# Patient Record
Sex: Female | Born: 1935 | Race: White | Hispanic: No | Smoking: Never smoker
Health system: Southern US, Community
[De-identification: ages and names within clinical notes are randomized; demographics above are authoritative.]

## PROBLEM LIST (undated history)

## (undated) DIAGNOSIS — I4891 Unspecified atrial fibrillation: Secondary | ICD-10-CM

## (undated) DIAGNOSIS — N289 Disorder of kidney and ureter, unspecified: Secondary | ICD-10-CM

## (undated) DIAGNOSIS — I219 Acute myocardial infarction, unspecified: Secondary | ICD-10-CM

## (undated) DIAGNOSIS — E1139 Type 2 diabetes mellitus with other diabetic ophthalmic complication: Secondary | ICD-10-CM

## (undated) DIAGNOSIS — E119 Type 2 diabetes mellitus without complications: Secondary | ICD-10-CM

## (undated) DIAGNOSIS — I10 Essential (primary) hypertension: Secondary | ICD-10-CM

## (undated) DIAGNOSIS — I872 Venous insufficiency (chronic) (peripheral): Secondary | ICD-10-CM

## (undated) DIAGNOSIS — N189 Chronic kidney disease, unspecified: Secondary | ICD-10-CM

## (undated) DIAGNOSIS — F068 Other specified mental disorders due to known physiological condition: Secondary | ICD-10-CM

## (undated) DIAGNOSIS — M810 Age-related osteoporosis without current pathological fracture: Secondary | ICD-10-CM

## (undated) DIAGNOSIS — E785 Hyperlipidemia, unspecified: Secondary | ICD-10-CM

## (undated) DIAGNOSIS — I080 Rheumatic disorders of both mitral and aortic valves: Secondary | ICD-10-CM

## (undated) DIAGNOSIS — K219 Gastro-esophageal reflux disease without esophagitis: Secondary | ICD-10-CM

## (undated) DIAGNOSIS — K279 Peptic ulcer, site unspecified, unspecified as acute or chronic, without hemorrhage or perforation: Secondary | ICD-10-CM

## (undated) HISTORY — DX: Other specified mental disorders due to known physiological condition: F06.8

## (undated) HISTORY — DX: Rheumatic disorders of both mitral and aortic valves: I08.0

## (undated) HISTORY — DX: Type 2 diabetes mellitus without complications: E11.9

## (undated) HISTORY — DX: Gastro-esophageal reflux disease without esophagitis: K21.9

## (undated) HISTORY — DX: Acute myocardial infarction, unspecified: I21.9

## (undated) HISTORY — DX: Peptic ulcer, site unspecified, unspecified as acute or chronic, without hemorrhage or perforation: K27.9

## (undated) HISTORY — DX: Essential (primary) hypertension: I10

## (undated) HISTORY — DX: Age-related osteoporosis without current pathological fracture: M81.0

## (undated) HISTORY — DX: Chronic kidney disease, unspecified: N18.9

## (undated) HISTORY — DX: Disorder of kidney and ureter, unspecified: N28.9

## (undated) HISTORY — DX: Type 2 diabetes mellitus with other diabetic ophthalmic complication: E11.39

## (undated) HISTORY — DX: Venous insufficiency (chronic) (peripheral): I87.2

## (undated) HISTORY — DX: Hyperlipidemia, unspecified: E78.5

## (undated) HISTORY — DX: Unspecified atrial fibrillation: I48.91

---

## 1999-03-11 ENCOUNTER — Other Ambulatory Visit: Admission: RE | Admit: 1999-03-11 | Discharge: 1999-03-11 | Payer: Self-pay | Admitting: Obstetrics and Gynecology

## 1999-08-15 ENCOUNTER — Encounter (INDEPENDENT_AMBULATORY_CARE_PROVIDER_SITE_OTHER): Payer: Self-pay | Admitting: Specialist

## 1999-08-15 ENCOUNTER — Other Ambulatory Visit: Admission: RE | Admit: 1999-08-15 | Discharge: 1999-08-15 | Payer: Self-pay | Admitting: Gastroenterology

## 1999-09-03 ENCOUNTER — Encounter: Payer: Self-pay | Admitting: Neurology

## 1999-09-03 ENCOUNTER — Encounter: Admission: RE | Admit: 1999-09-03 | Discharge: 1999-09-03 | Payer: Self-pay | Admitting: Neurology

## 2002-11-07 ENCOUNTER — Other Ambulatory Visit: Admission: RE | Admit: 2002-11-07 | Discharge: 2002-11-07 | Payer: Self-pay | Admitting: Obstetrics and Gynecology

## 2004-02-02 ENCOUNTER — Emergency Department (HOSPITAL_COMMUNITY): Admission: EM | Admit: 2004-02-02 | Discharge: 2004-02-02 | Payer: Self-pay | Admitting: Emergency Medicine

## 2004-02-27 ENCOUNTER — Other Ambulatory Visit: Admission: RE | Admit: 2004-02-27 | Discharge: 2004-02-27 | Payer: Self-pay | Admitting: Obstetrics and Gynecology

## 2004-05-08 ENCOUNTER — Ambulatory Visit: Payer: Self-pay | Admitting: Internal Medicine

## 2004-08-14 ENCOUNTER — Ambulatory Visit: Payer: Self-pay | Admitting: Internal Medicine

## 2004-08-26 ENCOUNTER — Ambulatory Visit: Payer: Self-pay

## 2004-11-12 ENCOUNTER — Ambulatory Visit: Payer: Self-pay | Admitting: Internal Medicine

## 2004-12-25 ENCOUNTER — Emergency Department (HOSPITAL_COMMUNITY): Admission: EM | Admit: 2004-12-25 | Discharge: 2004-12-25 | Payer: Self-pay | Admitting: *Deleted

## 2005-02-11 ENCOUNTER — Ambulatory Visit: Payer: Self-pay | Admitting: Internal Medicine

## 2005-03-19 ENCOUNTER — Ambulatory Visit: Payer: Self-pay | Admitting: Internal Medicine

## 2005-05-07 ENCOUNTER — Ambulatory Visit: Payer: Self-pay | Admitting: Internal Medicine

## 2005-07-20 ENCOUNTER — Ambulatory Visit: Payer: Self-pay | Admitting: Internal Medicine

## 2005-08-27 ENCOUNTER — Ambulatory Visit: Payer: Self-pay | Admitting: Internal Medicine

## 2005-09-04 ENCOUNTER — Ambulatory Visit: Payer: Self-pay | Admitting: Internal Medicine

## 2005-10-12 ENCOUNTER — Ambulatory Visit: Payer: Self-pay | Admitting: Internal Medicine

## 2005-12-08 ENCOUNTER — Ambulatory Visit: Payer: Self-pay | Admitting: Internal Medicine

## 2006-01-20 ENCOUNTER — Ambulatory Visit: Payer: Self-pay | Admitting: Internal Medicine

## 2006-05-05 ENCOUNTER — Inpatient Hospital Stay (HOSPITAL_COMMUNITY): Admission: EM | Admit: 2006-05-05 | Discharge: 2006-05-09 | Payer: Self-pay | Admitting: Emergency Medicine

## 2006-05-05 ENCOUNTER — Ambulatory Visit: Payer: Self-pay | Admitting: Internal Medicine

## 2006-05-05 ENCOUNTER — Ambulatory Visit: Payer: Self-pay | Admitting: Cardiology

## 2006-05-05 LAB — CONVERTED CEMR LAB
ALT: 16 units/L (ref 0–40)
AST: 30 units/L (ref 0–37)
BUN: 22 mg/dL (ref 6–23)
Cholesterol: 126 mg/dL (ref 0–200)
Creatinine, Ser: 1.3 mg/dL — ABNORMAL HIGH (ref 0.4–1.2)
Glucose, Bld: 128 mg/dL — ABNORMAL HIGH (ref 70–99)
Sodium: 142 meq/L (ref 135–145)

## 2006-05-06 ENCOUNTER — Encounter: Payer: Self-pay | Admitting: Cardiology

## 2006-05-12 ENCOUNTER — Ambulatory Visit: Payer: Self-pay | Admitting: Cardiology

## 2006-05-19 ENCOUNTER — Ambulatory Visit: Payer: Self-pay | Admitting: Internal Medicine

## 2006-05-27 ENCOUNTER — Ambulatory Visit: Payer: Self-pay | Admitting: Cardiology

## 2006-06-28 ENCOUNTER — Ambulatory Visit: Payer: Self-pay | Admitting: Cardiology

## 2006-06-28 ENCOUNTER — Ambulatory Visit: Payer: Self-pay | Admitting: Internal Medicine

## 2006-08-10 ENCOUNTER — Ambulatory Visit: Payer: Self-pay | Admitting: Internal Medicine

## 2006-08-10 LAB — CONVERTED CEMR LAB
ALT: 13 units/L (ref 0–40)
Bilirubin, Direct: 0.2 mg/dL (ref 0.0–0.3)
CO2: 31 meq/L (ref 19–32)
Calcium: 9.1 mg/dL (ref 8.4–10.5)
GFR calc Af Amer: 60 mL/min
GFR calc non Af Amer: 50 mL/min
Glucose, Bld: 220 mg/dL — ABNORMAL HIGH (ref 70–99)
Hgb A1c MFr Bld: 8.4 % — ABNORMAL HIGH (ref 4.6–6.0)
Sodium: 141 meq/L (ref 135–145)
Total Protein: 6.2 g/dL (ref 6.0–8.3)

## 2006-09-09 ENCOUNTER — Ambulatory Visit: Payer: Self-pay | Admitting: Cardiology

## 2006-09-20 ENCOUNTER — Ambulatory Visit: Payer: Self-pay | Admitting: Internal Medicine

## 2006-11-02 ENCOUNTER — Ambulatory Visit: Payer: Self-pay | Admitting: Internal Medicine

## 2006-11-02 LAB — CONVERTED CEMR LAB
ALT: 18 units/L (ref 0–35)
AST: 22 units/L (ref 0–37)
Albumin: 3.3 g/dL — ABNORMAL LOW (ref 3.5–5.2)
Basophils Absolute: 0.1 10*3/uL (ref 0.0–0.1)
Calcium: 9 mg/dL (ref 8.4–10.5)
Chloride: 106 meq/L (ref 96–112)
Creatinine, Ser: 0.9 mg/dL (ref 0.4–1.2)
Eosinophils Absolute: 0.3 10*3/uL (ref 0.0–0.6)
Eosinophils Relative: 4.9 % (ref 0.0–5.0)
GFR calc non Af Amer: 63 mL/min
HCT: 36.9 % (ref 36.0–46.0)
MCHC: 34.2 g/dL (ref 30.0–36.0)
MCV: 87.3 fL (ref 78.0–100.0)
Platelets: 222 10*3/uL (ref 150–400)
RBC: 4.23 M/uL (ref 3.87–5.11)
RDW: 12.3 % (ref 11.5–14.6)
Total Bilirubin: 0.8 mg/dL (ref 0.3–1.2)
WBC: 5.8 10*3/uL (ref 4.5–10.5)

## 2006-11-09 ENCOUNTER — Ambulatory Visit: Payer: Self-pay | Admitting: Internal Medicine

## 2006-12-27 ENCOUNTER — Encounter: Payer: Self-pay | Admitting: Internal Medicine

## 2006-12-27 DIAGNOSIS — Z9089 Acquired absence of other organs: Secondary | ICD-10-CM | POA: Insufficient documentation

## 2006-12-27 DIAGNOSIS — I219 Acute myocardial infarction, unspecified: Secondary | ICD-10-CM | POA: Insufficient documentation

## 2006-12-27 DIAGNOSIS — K279 Peptic ulcer, site unspecified, unspecified as acute or chronic, without hemorrhage or perforation: Secondary | ICD-10-CM | POA: Insufficient documentation

## 2006-12-27 DIAGNOSIS — E1139 Type 2 diabetes mellitus with other diabetic ophthalmic complication: Secondary | ICD-10-CM | POA: Insufficient documentation

## 2006-12-27 DIAGNOSIS — E785 Hyperlipidemia, unspecified: Secondary | ICD-10-CM

## 2006-12-27 DIAGNOSIS — M810 Age-related osteoporosis without current pathological fracture: Secondary | ICD-10-CM | POA: Insufficient documentation

## 2006-12-27 DIAGNOSIS — K219 Gastro-esophageal reflux disease without esophagitis: Secondary | ICD-10-CM | POA: Insufficient documentation

## 2006-12-27 DIAGNOSIS — I080 Rheumatic disorders of both mitral and aortic valves: Secondary | ICD-10-CM

## 2006-12-27 DIAGNOSIS — N189 Chronic kidney disease, unspecified: Secondary | ICD-10-CM

## 2006-12-27 DIAGNOSIS — E119 Type 2 diabetes mellitus without complications: Secondary | ICD-10-CM

## 2006-12-27 DIAGNOSIS — F068 Other specified mental disorders due to known physiological condition: Secondary | ICD-10-CM

## 2006-12-27 DIAGNOSIS — I4891 Unspecified atrial fibrillation: Secondary | ICD-10-CM | POA: Insufficient documentation

## 2007-02-10 ENCOUNTER — Ambulatory Visit: Payer: Self-pay | Admitting: Internal Medicine

## 2007-02-10 LAB — CONVERTED CEMR LAB
CO2: 28 meq/L (ref 19–32)
Calcium: 9 mg/dL (ref 8.4–10.5)
Chloride: 103 meq/L (ref 96–112)
Creatinine, Ser: 0.8 mg/dL (ref 0.4–1.2)
Glucose, Bld: 133 mg/dL — ABNORMAL HIGH (ref 70–99)

## 2007-02-17 ENCOUNTER — Ambulatory Visit: Payer: Self-pay | Admitting: Internal Medicine

## 2007-02-17 ENCOUNTER — Encounter: Payer: Self-pay | Admitting: Internal Medicine

## 2007-03-18 ENCOUNTER — Telehealth: Payer: Self-pay | Admitting: Internal Medicine

## 2007-03-18 ENCOUNTER — Encounter: Payer: Self-pay | Admitting: Internal Medicine

## 2007-05-13 ENCOUNTER — Ambulatory Visit: Payer: Self-pay | Admitting: Internal Medicine

## 2007-05-13 DIAGNOSIS — N181 Chronic kidney disease, stage 1: Secondary | ICD-10-CM | POA: Insufficient documentation

## 2007-05-13 LAB — CONVERTED CEMR LAB
ALT: 15 units/L (ref 0–35)
AST: 19 units/L (ref 0–37)
Bilirubin, Direct: 0.1 mg/dL (ref 0.0–0.3)
CO2: 31 meq/L (ref 19–32)
Calcium: 9.4 mg/dL (ref 8.4–10.5)
Chloride: 106 meq/L (ref 96–112)
Creatinine, Ser: 0.9 mg/dL (ref 0.4–1.2)
GFR calc non Af Amer: 63 mL/min
Glucose, Bld: 160 mg/dL — ABNORMAL HIGH (ref 70–99)
Total Bilirubin: 1 mg/dL (ref 0.3–1.2)

## 2007-05-16 ENCOUNTER — Encounter: Payer: Self-pay | Admitting: Internal Medicine

## 2007-05-20 ENCOUNTER — Ambulatory Visit: Payer: Self-pay | Admitting: Internal Medicine

## 2007-05-20 DIAGNOSIS — L299 Pruritus, unspecified: Secondary | ICD-10-CM | POA: Insufficient documentation

## 2007-06-02 ENCOUNTER — Encounter: Payer: Self-pay | Admitting: Internal Medicine

## 2007-07-11 ENCOUNTER — Encounter: Payer: Self-pay | Admitting: Internal Medicine

## 2007-08-24 ENCOUNTER — Encounter: Payer: Self-pay | Admitting: Internal Medicine

## 2007-09-14 ENCOUNTER — Ambulatory Visit: Payer: Self-pay | Admitting: Internal Medicine

## 2007-09-15 ENCOUNTER — Encounter: Payer: Self-pay | Admitting: Internal Medicine

## 2007-09-16 LAB — CONVERTED CEMR LAB
ALT: 16 units/L (ref 0–35)
Bilirubin, Direct: 0.1 mg/dL (ref 0.0–0.3)
CO2: 29 meq/L (ref 19–32)
Calcium: 9.3 mg/dL (ref 8.4–10.5)
GFR calc Af Amer: 67 mL/min
HDL: 41.4 mg/dL (ref >39.0)
Sodium: 140 meq/L (ref 135–145)
TSH: 3.26 microintl units/mL (ref 0.35–5.50)
Total Bilirubin: 0.8 mg/dL (ref 0.3–1.2)
Total CHOL/HDL Ratio: 4.1

## 2007-09-19 ENCOUNTER — Ambulatory Visit: Payer: Self-pay | Admitting: Internal Medicine

## 2007-09-19 DIAGNOSIS — H01009 Unspecified blepharitis unspecified eye, unspecified eyelid: Secondary | ICD-10-CM | POA: Insufficient documentation

## 2007-09-19 DIAGNOSIS — I10 Essential (primary) hypertension: Secondary | ICD-10-CM | POA: Insufficient documentation

## 2007-10-08 ENCOUNTER — Emergency Department (HOSPITAL_COMMUNITY): Admission: EM | Admit: 2007-10-08 | Discharge: 2007-10-08 | Payer: Self-pay | Admitting: Emergency Medicine

## 2007-10-08 ENCOUNTER — Telehealth: Payer: Self-pay | Admitting: Internal Medicine

## 2007-10-10 ENCOUNTER — Encounter: Payer: Self-pay | Admitting: Internal Medicine

## 2007-12-07 ENCOUNTER — Telehealth: Payer: Self-pay | Admitting: Internal Medicine

## 2007-12-12 ENCOUNTER — Encounter: Payer: Self-pay | Admitting: Internal Medicine

## 2007-12-16 ENCOUNTER — Ambulatory Visit: Payer: Self-pay | Admitting: Internal Medicine

## 2007-12-16 LAB — CONVERTED CEMR LAB
ALT: 16 units/L (ref 0–35)
AST: 23 units/L (ref 0–37)
Alkaline Phosphatase: 106 units/L (ref 39–117)
Bilirubin, Direct: 0.2 mg/dL (ref 0.0–0.3)
CO2: 31 meq/L (ref 19–32)
Chloride: 106 meq/L (ref 96–112)
Sodium: 144 meq/L (ref 135–145)
Total Bilirubin: 0.9 mg/dL (ref 0.3–1.2)

## 2007-12-19 ENCOUNTER — Telehealth: Payer: Self-pay | Admitting: Internal Medicine

## 2007-12-20 ENCOUNTER — Telehealth: Payer: Self-pay | Admitting: Internal Medicine

## 2008-01-04 ENCOUNTER — Ambulatory Visit: Payer: Self-pay | Admitting: Internal Medicine

## 2008-01-04 DIAGNOSIS — K149 Disease of tongue, unspecified: Secondary | ICD-10-CM | POA: Insufficient documentation

## 2008-01-13 ENCOUNTER — Encounter: Payer: Self-pay | Admitting: Internal Medicine

## 2008-01-20 ENCOUNTER — Telehealth: Payer: Self-pay | Admitting: Internal Medicine

## 2008-01-23 ENCOUNTER — Encounter: Payer: Self-pay | Admitting: Internal Medicine

## 2008-02-02 ENCOUNTER — Encounter: Payer: Self-pay | Admitting: Internal Medicine

## 2008-02-06 ENCOUNTER — Encounter: Payer: Self-pay | Admitting: Internal Medicine

## 2008-02-18 ENCOUNTER — Encounter: Payer: Self-pay | Admitting: Internal Medicine

## 2008-03-27 ENCOUNTER — Encounter: Payer: Self-pay | Admitting: Internal Medicine

## 2008-04-20 DIAGNOSIS — I872 Venous insufficiency (chronic) (peripheral): Secondary | ICD-10-CM

## 2008-04-20 HISTORY — DX: Venous insufficiency (chronic) (peripheral): I87.2

## 2008-04-26 ENCOUNTER — Ambulatory Visit: Payer: Self-pay | Admitting: Internal Medicine

## 2008-04-27 LAB — CONVERTED CEMR LAB
CO2: 29 meq/L (ref 19–32)
Calcium: 9.4 mg/dL (ref 8.4–10.5)
Creatinine, Ser: 0.8 mg/dL (ref 0.4–1.2)
Glucose, Bld: 193 mg/dL — ABNORMAL HIGH (ref 70–99)

## 2008-05-04 ENCOUNTER — Telehealth: Payer: Self-pay | Admitting: Internal Medicine

## 2008-05-04 ENCOUNTER — Ambulatory Visit: Payer: Self-pay | Admitting: Internal Medicine

## 2008-05-04 DIAGNOSIS — N259 Disorder resulting from impaired renal tubular function, unspecified: Secondary | ICD-10-CM | POA: Insufficient documentation

## 2008-06-14 ENCOUNTER — Encounter: Payer: Self-pay | Admitting: Internal Medicine

## 2008-06-15 ENCOUNTER — Telehealth: Payer: Self-pay | Admitting: Internal Medicine

## 2008-06-29 ENCOUNTER — Encounter: Payer: Self-pay | Admitting: Internal Medicine

## 2008-07-02 ENCOUNTER — Telehealth: Payer: Self-pay | Admitting: Internal Medicine

## 2008-07-18 ENCOUNTER — Ambulatory Visit: Payer: Self-pay | Admitting: Internal Medicine

## 2008-07-18 DIAGNOSIS — R609 Edema, unspecified: Secondary | ICD-10-CM

## 2008-07-19 ENCOUNTER — Ambulatory Visit: Payer: Self-pay

## 2008-07-22 LAB — CONVERTED CEMR LAB
ALT: 21 units/L (ref 0–35)
AST: 22 units/L (ref 0–37)
BUN: 17 mg/dL (ref 6–23)
Bilirubin, Direct: 0 mg/dL (ref 0.0–0.3)
CO2: 32 meq/L (ref 19–32)
GFR calc non Af Amer: 55.42 mL/min (ref >60)
Glucose, Bld: 294 mg/dL — ABNORMAL HIGH (ref 70–99)
Nitrite: NEGATIVE
Potassium: 4.1 meq/L (ref 3.5–5.1)
Specific Gravity, Urine: 1.015 (ref 1.000–1.030)
Total Bilirubin: 0.7 mg/dL (ref 0.3–1.2)
Total Protein, Urine: NEGATIVE mg/dL
Urine Glucose: NEGATIVE mg/dL
Urobilinogen, UA: 0.2 (ref 0.0–1.0)

## 2008-08-01 ENCOUNTER — Encounter: Payer: Self-pay | Admitting: Internal Medicine

## 2008-08-13 ENCOUNTER — Telehealth: Payer: Self-pay | Admitting: Internal Medicine

## 2008-08-27 ENCOUNTER — Encounter: Payer: Self-pay | Admitting: Internal Medicine

## 2008-08-28 ENCOUNTER — Telehealth: Payer: Self-pay | Admitting: Internal Medicine

## 2008-08-28 ENCOUNTER — Encounter: Payer: Self-pay | Admitting: Internal Medicine

## 2008-08-29 ENCOUNTER — Ambulatory Visit: Payer: Self-pay | Admitting: Internal Medicine

## 2008-08-29 LAB — CONVERTED CEMR LAB
BUN: 19 mg/dL (ref 6–23)
CO2: 30 meq/L (ref 19–32)
Chloride: 103 meq/L (ref 96–112)
Creatinine, Ser: 1 mg/dL (ref 0.4–1.2)
Glucose, Bld: 263 mg/dL — ABNORMAL HIGH (ref 70–99)
Hgb A1c MFr Bld: 10.1 % — ABNORMAL HIGH (ref 4.6–6.5)

## 2008-09-05 ENCOUNTER — Ambulatory Visit: Payer: Self-pay | Admitting: Internal Medicine

## 2008-09-26 ENCOUNTER — Encounter: Payer: Self-pay | Admitting: Internal Medicine

## 2008-10-02 ENCOUNTER — Encounter: Payer: Self-pay | Admitting: Internal Medicine

## 2008-10-03 ENCOUNTER — Telehealth: Payer: Self-pay | Admitting: Internal Medicine

## 2008-10-04 ENCOUNTER — Ambulatory Visit: Payer: Self-pay | Admitting: Internal Medicine

## 2008-10-09 ENCOUNTER — Encounter: Payer: Self-pay | Admitting: Internal Medicine

## 2008-10-18 ENCOUNTER — Encounter: Payer: Self-pay | Admitting: Internal Medicine

## 2008-10-18 ENCOUNTER — Telehealth: Payer: Self-pay | Admitting: Internal Medicine

## 2008-10-24 ENCOUNTER — Telehealth: Payer: Self-pay | Admitting: Internal Medicine

## 2008-10-25 ENCOUNTER — Ambulatory Visit: Payer: Self-pay | Admitting: Internal Medicine

## 2008-10-26 ENCOUNTER — Ambulatory Visit: Payer: Self-pay | Admitting: Internal Medicine

## 2008-10-31 ENCOUNTER — Telehealth: Payer: Self-pay | Admitting: Internal Medicine

## 2008-11-01 ENCOUNTER — Telehealth: Payer: Self-pay | Admitting: Internal Medicine

## 2008-11-01 ENCOUNTER — Ambulatory Visit: Payer: Self-pay | Admitting: Internal Medicine

## 2008-11-01 DIAGNOSIS — R748 Abnormal levels of other serum enzymes: Secondary | ICD-10-CM | POA: Insufficient documentation

## 2008-11-01 DIAGNOSIS — R21 Rash and other nonspecific skin eruption: Secondary | ICD-10-CM | POA: Insufficient documentation

## 2008-11-01 LAB — CONVERTED CEMR LAB
BUN: 17 mg/dL (ref 6–23)
Bilirubin Urine: NEGATIVE
Bilirubin, Direct: 0.1 mg/dL (ref 0.0–0.3)
CO2: 34 meq/L — ABNORMAL HIGH (ref 19–32)
Chloride: 97 meq/L (ref 96–112)
Creatinine, Ser: 1 mg/dL (ref 0.4–1.2)
Eosinophils Absolute: 0.3 10*3/uL (ref 0.0–0.7)
Glucose, Bld: 295 mg/dL — ABNORMAL HIGH (ref 70–99)
HCT: 36.8 % (ref 36.0–46.0)
Hgb A1c MFr Bld: 10.9 % — ABNORMAL HIGH (ref 4.6–6.5)
Ketones, ur: NEGATIVE mg/dL
Lymphs Abs: 1.5 10*3/uL (ref 0.7–4.0)
MCHC: 34.8 g/dL (ref 30.0–36.0)
MCV: 89.9 fL (ref 78.0–100.0)
Monocytes Absolute: 0.7 10*3/uL (ref 0.1–1.0)
Neutrophils Relative %: 60.9 % (ref 43.0–77.0)
Nitrite: NEGATIVE
Platelets: 214 10*3/uL (ref 150.0–400.0)
Potassium: 4 meq/L (ref 3.5–5.1)
RDW: 12.6 % (ref 11.5–14.6)
Sed Rate: 25 mm/hr — ABNORMAL HIGH (ref 0–22)
Specific Gravity, Urine: 1.025 (ref 1.000–1.030)
TSH: 3.72 microintl units/mL (ref 0.35–5.50)
Total Bilirubin: 0.6 mg/dL (ref 0.3–1.2)
Urobilinogen, UA: 0.2 (ref 0.0–1.0)
pH: 5 (ref 5.0–8.0)

## 2008-11-02 ENCOUNTER — Encounter: Payer: Self-pay | Admitting: Internal Medicine

## 2008-11-16 ENCOUNTER — Telehealth: Payer: Self-pay | Admitting: Internal Medicine

## 2008-11-19 ENCOUNTER — Ambulatory Visit: Payer: Self-pay | Admitting: Internal Medicine

## 2008-11-19 ENCOUNTER — Encounter: Payer: Self-pay | Admitting: Internal Medicine

## 2008-11-20 ENCOUNTER — Encounter: Payer: Self-pay | Admitting: Internal Medicine

## 2008-11-21 ENCOUNTER — Encounter: Payer: Self-pay | Admitting: Internal Medicine

## 2008-11-26 ENCOUNTER — Telehealth: Payer: Self-pay | Admitting: Internal Medicine

## 2008-12-11 ENCOUNTER — Encounter: Payer: Self-pay | Admitting: Internal Medicine

## 2008-12-20 ENCOUNTER — Telehealth: Payer: Self-pay | Admitting: Internal Medicine

## 2008-12-26 ENCOUNTER — Ambulatory Visit: Payer: Self-pay | Admitting: Internal Medicine

## 2008-12-27 LAB — CONVERTED CEMR LAB
ALT: 21 units/L (ref 0–35)
BUN: 18 mg/dL (ref 6–23)
Bilirubin, Direct: 0.1 mg/dL (ref 0.0–0.3)
CO2: 31 meq/L (ref 19–32)
Calcium: 9.1 mg/dL (ref 8.4–10.5)
Chloride: 104 meq/L (ref 96–112)
Creatinine, Ser: 0.9 mg/dL (ref 0.4–1.2)
Hgb A1c MFr Bld: 9.5 % — ABNORMAL HIGH (ref 4.6–6.5)
Total Protein: 6.2 g/dL (ref 6.0–8.3)

## 2008-12-31 ENCOUNTER — Ambulatory Visit: Payer: Self-pay | Admitting: Internal Medicine

## 2009-01-07 ENCOUNTER — Encounter: Payer: Self-pay | Admitting: Internal Medicine

## 2009-01-24 ENCOUNTER — Encounter: Payer: Self-pay | Admitting: Internal Medicine

## 2009-03-13 ENCOUNTER — Telehealth: Payer: Self-pay | Admitting: Internal Medicine

## 2009-03-28 ENCOUNTER — Ambulatory Visit: Payer: Self-pay | Admitting: Internal Medicine

## 2009-03-30 LAB — CONVERTED CEMR LAB
AST: 24 units/L (ref 0–37)
Albumin: 3.5 g/dL (ref 3.5–5.2)
Alkaline Phosphatase: 132 units/L — ABNORMAL HIGH (ref 39–117)
Bilirubin, Direct: 0.1 mg/dL (ref 0.0–0.3)
Calcium: 9.2 mg/dL (ref 8.4–10.5)
GFR calc non Af Amer: 55.33 mL/min (ref >60)
Potassium: 3.6 meq/L (ref 3.5–5.1)
Sodium: 145 meq/L (ref 135–145)
Total Bilirubin: 0.7 mg/dL (ref 0.3–1.2)

## 2009-04-18 ENCOUNTER — Telehealth: Payer: Self-pay | Admitting: Internal Medicine

## 2009-04-18 ENCOUNTER — Ambulatory Visit: Payer: Self-pay | Admitting: Internal Medicine

## 2009-04-18 DIAGNOSIS — J209 Acute bronchitis, unspecified: Secondary | ICD-10-CM | POA: Insufficient documentation

## 2009-04-24 ENCOUNTER — Encounter: Payer: Self-pay | Admitting: Internal Medicine

## 2009-04-29 ENCOUNTER — Encounter: Payer: Self-pay | Admitting: Internal Medicine

## 2009-05-13 ENCOUNTER — Telehealth: Payer: Self-pay | Admitting: Internal Medicine

## 2009-05-21 ENCOUNTER — Encounter: Payer: Self-pay | Admitting: Internal Medicine

## 2009-05-29 ENCOUNTER — Encounter: Payer: Self-pay | Admitting: Internal Medicine

## 2009-06-03 ENCOUNTER — Ambulatory Visit: Payer: Self-pay | Admitting: Internal Medicine

## 2009-06-05 ENCOUNTER — Encounter: Payer: Self-pay | Admitting: Internal Medicine

## 2009-06-17 ENCOUNTER — Telehealth: Payer: Self-pay | Admitting: Internal Medicine

## 2009-06-18 ENCOUNTER — Encounter: Payer: Self-pay | Admitting: Internal Medicine

## 2009-06-19 ENCOUNTER — Telehealth: Payer: Self-pay | Admitting: Internal Medicine

## 2009-06-20 ENCOUNTER — Ambulatory Visit: Payer: Self-pay | Admitting: Internal Medicine

## 2009-06-25 ENCOUNTER — Encounter: Payer: Self-pay | Admitting: Internal Medicine

## 2009-07-10 ENCOUNTER — Ambulatory Visit: Payer: Self-pay | Admitting: Internal Medicine

## 2009-07-10 LAB — CONVERTED CEMR LAB
BUN: 15 mg/dL (ref 6–23)
Bilirubin, Direct: 0.1 mg/dL (ref 0.0–0.3)
CO2: 34 meq/L — ABNORMAL HIGH (ref 19–32)
Chloride: 102 meq/L (ref 96–112)
Cholesterol: 140 mg/dL (ref 0–200)
Creatinine, Ser: 0.9 mg/dL (ref 0.4–1.2)
Glucose, Bld: 93 mg/dL (ref 70–99)
LDL Cholesterol: 69 mg/dL (ref 0–99)
Potassium: 3.9 meq/L (ref 3.5–5.1)
Total CHOL/HDL Ratio: 3
Total Protein: 6.8 g/dL (ref 6.0–8.3)
VLDL: 25.4 mg/dL (ref 0.0–40.0)

## 2009-07-15 ENCOUNTER — Encounter: Payer: Self-pay | Admitting: Internal Medicine

## 2009-07-17 ENCOUNTER — Ambulatory Visit: Payer: Self-pay | Admitting: Internal Medicine

## 2009-07-19 ENCOUNTER — Telehealth: Payer: Self-pay | Admitting: Internal Medicine

## 2009-07-19 ENCOUNTER — Ambulatory Visit: Payer: Self-pay | Admitting: Internal Medicine

## 2009-07-19 DIAGNOSIS — R10819 Abdominal tenderness, unspecified site: Secondary | ICD-10-CM

## 2009-07-21 ENCOUNTER — Encounter: Payer: Self-pay | Admitting: Internal Medicine

## 2009-07-24 ENCOUNTER — Ambulatory Visit: Payer: Self-pay | Admitting: Internal Medicine

## 2009-07-24 ENCOUNTER — Telehealth: Payer: Self-pay | Admitting: Internal Medicine

## 2009-07-30 ENCOUNTER — Telehealth: Payer: Self-pay | Admitting: Internal Medicine

## 2009-08-08 ENCOUNTER — Telehealth: Payer: Self-pay | Admitting: Internal Medicine

## 2009-08-13 ENCOUNTER — Encounter: Payer: Self-pay | Admitting: Internal Medicine

## 2009-08-16 ENCOUNTER — Telehealth: Payer: Self-pay | Admitting: Internal Medicine

## 2009-09-17 ENCOUNTER — Encounter: Payer: Self-pay | Admitting: Internal Medicine

## 2009-09-20 ENCOUNTER — Encounter: Payer: Self-pay | Admitting: Internal Medicine

## 2009-09-25 ENCOUNTER — Telehealth: Payer: Self-pay | Admitting: Internal Medicine

## 2009-09-25 ENCOUNTER — Encounter: Payer: Self-pay | Admitting: Internal Medicine

## 2009-09-26 ENCOUNTER — Encounter: Payer: Self-pay | Admitting: Internal Medicine

## 2009-11-07 ENCOUNTER — Ambulatory Visit: Payer: Self-pay | Admitting: Internal Medicine

## 2009-11-12 LAB — CONVERTED CEMR LAB
Chloride: 110 meq/L (ref 96–112)
GFR calc non Af Amer: 70.47 mL/min (ref >60)
Glucose, Bld: 68 mg/dL — ABNORMAL LOW (ref 70–99)
Potassium: 4.1 meq/L (ref 3.5–5.1)
Sodium: 146 meq/L — ABNORMAL HIGH (ref 135–145)
Vitamin B-12: 193 pg/mL — ABNORMAL LOW (ref 211–911)

## 2009-11-14 ENCOUNTER — Ambulatory Visit: Payer: Self-pay | Admitting: Internal Medicine

## 2009-11-14 DIAGNOSIS — E162 Hypoglycemia, unspecified: Secondary | ICD-10-CM

## 2009-11-14 DIAGNOSIS — E538 Deficiency of other specified B group vitamins: Secondary | ICD-10-CM | POA: Insufficient documentation

## 2009-11-15 ENCOUNTER — Encounter: Payer: Self-pay | Admitting: Internal Medicine

## 2009-11-20 ENCOUNTER — Encounter: Payer: Self-pay | Admitting: Internal Medicine

## 2009-11-22 ENCOUNTER — Ambulatory Visit: Payer: Self-pay | Admitting: Internal Medicine

## 2009-11-25 ENCOUNTER — Encounter: Payer: Self-pay | Admitting: Internal Medicine

## 2009-11-26 ENCOUNTER — Encounter: Payer: Self-pay | Admitting: Internal Medicine

## 2009-11-29 ENCOUNTER — Encounter: Payer: Self-pay | Admitting: Internal Medicine

## 2009-12-04 ENCOUNTER — Telehealth: Payer: Self-pay | Admitting: Internal Medicine

## 2009-12-04 ENCOUNTER — Encounter: Payer: Self-pay | Admitting: Internal Medicine

## 2009-12-05 ENCOUNTER — Encounter: Payer: Self-pay | Admitting: Internal Medicine

## 2009-12-17 ENCOUNTER — Telehealth: Payer: Self-pay | Admitting: Internal Medicine

## 2009-12-20 ENCOUNTER — Encounter: Payer: Self-pay | Admitting: Internal Medicine

## 2009-12-26 ENCOUNTER — Encounter: Payer: Self-pay | Admitting: Internal Medicine

## 2010-01-15 ENCOUNTER — Ambulatory Visit: Payer: Self-pay | Admitting: Internal Medicine

## 2010-02-07 ENCOUNTER — Ambulatory Visit: Payer: Self-pay | Admitting: Internal Medicine

## 2010-02-08 LAB — CONVERTED CEMR LAB
Basophils Relative: 0.8 % (ref 0.0–3.0)
Chloride: 106 meq/L (ref 96–112)
Eosinophils Relative: 3.2 % (ref 0.0–5.0)
GFR calc non Af Amer: 67.53 mL/min (ref >60)
Glucose, Bld: 76 mg/dL (ref 70–99)
Hgb A1c MFr Bld: 7.8 % — ABNORMAL HIGH (ref 4.6–6.5)
Lymphocytes Relative: 20.1 % (ref 12.0–46.0)
Neutrophils Relative %: 67.5 % (ref 43.0–77.0)
Potassium: 4.3 meq/L (ref 3.5–5.1)
RBC: 4.56 M/uL (ref 3.87–5.11)
Sodium: 144 meq/L (ref 135–145)
WBC: 7.8 10*3/uL (ref 4.5–10.5)

## 2010-02-12 ENCOUNTER — Encounter: Payer: Self-pay | Admitting: Internal Medicine

## 2010-02-14 ENCOUNTER — Ambulatory Visit: Payer: Self-pay | Admitting: Internal Medicine

## 2010-02-20 ENCOUNTER — Encounter: Payer: Self-pay | Admitting: Internal Medicine

## 2010-04-24 ENCOUNTER — Encounter: Payer: Self-pay | Admitting: Internal Medicine

## 2010-05-01 ENCOUNTER — Telehealth: Payer: Self-pay | Admitting: Internal Medicine

## 2010-05-01 ENCOUNTER — Encounter: Payer: Self-pay | Admitting: Internal Medicine

## 2010-05-08 ENCOUNTER — Encounter: Payer: Self-pay | Admitting: Internal Medicine

## 2010-05-19 ENCOUNTER — Encounter: Payer: Self-pay | Admitting: Internal Medicine

## 2010-05-20 NOTE — Miscellaneous (Signed)
Summary: Plan/[facility]  Plan/[facility]   Imported By: Lester Byron 06/08/2009 10:52:04  _____________________________________________________________________  External Attachment:    Type:   Image     Comment:   External Document

## 2010-05-20 NOTE — Miscellaneous (Signed)
Summary: Rx quarterly/[facility]  Rx quarterly/[facility]   Imported By: Lester [place] 02/17/2010 09:10:19  _____________________________________________________________________  External Attachment:    Type:   Image     Comment:   External Document

## 2010-05-20 NOTE — Letter (Signed)
Summary: CMN for Diabetic Supplies/[facility]  CMN for Diabetic Supplies/[facility]   Imported By: Sherian Rein 06/28/2009 14:59:11  _____________________________________________________________________  External Attachment:    Type:   Image     Comment:   External Document

## 2010-05-20 NOTE — Miscellaneous (Signed)
Summary: Plan of Care / [facility]  Plan of Care / [facility]   Imported By: Lennie Odor 01/17/2010 14:34:02  _____________________________________________________________________  External Attachment:    Type:   Image     Comment:   External Document

## 2010-05-20 NOTE — Progress Notes (Signed)
Summary: FLU VACCINE  Phone Note From Other Clinic   Summary of Call: Pt recieved flu vaccine from [facility], EMR UPDATED Initial call taken by: Lamar Sprinkles, CMA,  May 13, 2009 8:23 AM      Immunization History:  Influenza Immunization History:    Influenza:  historical (12/31/2008)

## 2010-05-20 NOTE — Miscellaneous (Signed)
  Clinical Lists Changes  Medications: Added new medication of CEFTIN 250 MG TABS (CEFUROXIME AXETIL) 1 by mouth two times a day - Signed Rx of CEFTIN 250 MG TABS (CEFUROXIME AXETIL) 1 by mouth two times a day;  #10 x 0;  Signed;  Entered by: Lanier Prude, CMA(AAMA);  Authorized by: Tresa Garter MD;  Method used: Electronically to [facility] [address], [place], [place], [place]  [address], Ph: [phone], Fax: [phone]    Prescriptions: CEFTIN 250 MG TABS (CEFUROXIME AXETIL) 1 by mouth two times a day  #10 x 0   Entered by:   Lanier Prude, CMA(AAMA)   Authorized by:   Tresa Garter MD   Signed by:   Lanier Prude, [facility]) on 12/05/2009   Method used:   Electronically to        [facility] (retail)       [address]       [place], [place]  [address]       Ph: [phone]       Fax: [phone]   RxID:   [id number]

## 2010-05-20 NOTE — Miscellaneous (Signed)
Summary: Physician Verbal Order/[facility]  Physician Verbal Order/[facility]   Imported By: Sherian Rein 06/26/2009 14:31:52  _____________________________________________________________________  External Attachment:    Type:   Image     Comment:   External Document

## 2010-05-20 NOTE — Progress Notes (Signed)
Summary: UTI   Phone Note From Other Clinic   Summary of Call: U/a w/culture results were recieved. Per MD Levaquin 250mg  1 once daily x 10 days. Faxed order back to [facility], Fax [phone] Initial call taken by: Lamar Sprinkles, CMA,  September 25, 2009 9:28 AM  Follow-up for Phone Call        Agree. Thx! Follow-up by: Tresa Garter MD,  September 25, 2009 5:37 PM    New/Updated Medications: LEVAQUIN 250 MG TABS (LEVOFLOXACIN) 1 by mouth once daily x 10 days Prescriptions: LEVAQUIN 250 MG TABS (LEVOFLOXACIN) 1 by mouth once daily x 10 days  #10 x 0   Entered by:   Lamar Sprinkles, CMA   Authorized by:   Tresa Garter MD   Signed by:   Lamar Sprinkles, CMA on 09/25/2009   Method used:   Electronically to        [facility] (retail)       [address]       [place], [place]  [address]       Ph: [phone]       Fax: [phone]   RxID:   [id number]

## 2010-05-20 NOTE — Progress Notes (Signed)
Summary: RX   Phone Note From Pharmacy   Caller: [facility] [phone] Summary of Call: Pharm called, MD gave rx for ceftin. Pt has pcn allergy and there is possiblity of cross sensitivity. Is this ok to fill? Should they change med?  Initial call taken by: Lamar Sprinkles, CMA,  December 04, 2009 10:07 AM  Follow-up for Phone Call        ok to fill - the options are limited. Risk of reaction is <5%. Stop med if any problems ASAP Follow-up by: Tresa Garter MD,  December 04, 2009 1:07 PM  Additional Follow-up for Phone Call Additional follow up Details #1::        Pharm informed.  Additional Follow-up by: Lamar Sprinkles, CMA,  December 04, 2009 3:28 PM

## 2010-05-20 NOTE — Miscellaneous (Signed)
Summary: Order for Triamcinolone & Home Health/[facility]  Order for Triamcinolone & Home [facility]   Imported By: Sherian Rein 05/22/2009 07:55:58  _____________________________________________________________________  External Attachment:    Type:   Image     Comment:   External Document

## 2010-05-20 NOTE — Progress Notes (Signed)
Summary: asprin  Phone Note Refill Request Message from:  Fax from Pharmacy on August 16, 2009 1:34 PM  Refills Requested: Medication #1:  asprin 81mg  take 1 po qd   Last Refilled: 06/26/2009  Method Requested: Electronic Initial call taken by: Orlan Leavens,  August 16, 2009 1:35 PM    New/Updated Medications: QC LO-DOSE ASPIRIN 81 MG TBEC (ASPIRIN) take 1 by mouth once daily Prescriptions: QC LO-DOSE ASPIRIN 81 MG TBEC (ASPIRIN) take 1 by mouth once daily  #30 x 5   Entered by:   Orlan Leavens   Authorized by:   Tresa Garter MD   Signed by:   Orlan Leavens on 08/16/2009   Method used:   Electronically to        [facility] (retail)       [address]       [place], [place]  [address]       Ph: [phone]       Fax: [phone]   RxID:   [id number]

## 2010-05-20 NOTE — Assessment & Plan Note (Signed)
Summary: 3 MO ROV /NWS  #   Vital Signs:  Patient profile:   77 year old female Weight:      140 pounds Temp:     97.3 degrees F oral BP sitting:   86 / 34  (left arm)  Vitals Entered By: Tora Perches (July 17, 2009 1:44 PM) CC: f/u Is Patient Diabetic? No   Primary Care Provider:  Tresa Garter MD  CC:  f/u.  History of Present Illness: The patient presents for a follow up of hypertension, diabetes, hyperlipidemia . Dementia is worse.  Preventive Screening-Counseling & Management  Alcohol-Tobacco     Smoking Status: never  Current Medications (verified): 1)  Pravachol 40 Mg  Tabs (Pravastatin Sodium) .... Take One Tablet Daily 2)  Nexium 40 Mg  Cpdr (Esomeprazole Magnesium) .... Take One Tablet Daily 3)  Loratadine 10 Mg  Tabs (Loratadine) .... Take One Tablet Daily 4)  Promethazine Hcl 25 Mg  Tabs (Promethazine Hcl) .... Take As Needed 5)  Benefiber   Powd (Wheat Dextrin) .... Take Daily 6)  Calcium 600 + D 600-200 Mg-Unit  Tabs (Calcium Carbonate-Vitamin D) 7)  Docusate Sodium 100 Mg  Caps (Docusate Sodium) .Marland Kitchen.. 1 Two Times A Day 8)  Icaps  Caps (Multiple Vitamins-Minerals) .... Two Times A Day With Meals 9)  Amaryl 4 Mg Tabs (Glimepiride) .Marland Kitchen.. 1 By Mouth Q Am 10)  Delsym 30 Mg/59ml Lqcr (Dextromethorphan Polistirex) .Marland Kitchen.. 1 Tsp Two Times A Day For Cough 11)  Pataday 0.2 % Soln (Olopatadine Hcl) .... One Drop Each Eye As Needed For Itchy Eye. 12)  Triamcinolone Acetonide 0.1 % Oint (Triamcinolone Acetonide) .... Use 1-2 Times A Day 13)  Levemir Flexpen 100 Unit/ml Soln (Insulin Detemir) .... 22 U Qam and 10 U Q Pm 14)  Bd Autoshield 29g X 12mm Misc (Insulin Pen Needle) .... Use Once Daily As Dirrected 15)  Lasix 20 Mg Tabs (Furosemide) .... Two Times A Day 16)  Galantamine Hydrobromide 12 Mg Tabs (Galantamine Hydrobromide) .Marland Kitchen.. 1 By Mouth Bid 17)  Triamcinolone Acetonide 0.5 % Oint (Triamcinolone Acetonide) .... Use Two Times A Day As Needed On  Rash  Allergies: 1)  ! Penicillin  Past History:  Past Medical History: Last updated: 12/31/2008 DEMENTIA (ICD-294.8) GERD (ICD-530.81) OSTEOPOROSIS (ICD-733.00) DIABETIC  RETINOPATHY (ICD-250.50) RENAL INSUFFICIENCY, CHRONIC (ICD-585.9) DYSLIPIDEMIA (ICD-272.4) DIABETES MELLITUS, TYPE II (ICD-250.00) MITRAL REGURGITATION, MILD (ICD-396.3) Hx of MYOCARDIAL INFARCTION (ICD-410.90) Hx of ATRIAL FIBRILLATION WITH RAPID VENTRICULAR RESPONSE (ICD-427.31) PEPTIC ULCER DISEASE (ICD-533.90) DEMENTIA (ICD-294.8) GERD (ICD-530.81) OSTEOPOROSIS (ICD-733.00) DIABETIC  RETINOPATHY (ICD-250.50) RENAL INSUFFICIENCY, CHRONIC (ICD-585.9) DYSLIPIDEMIA (ICD-272.4) DIABETES MELLITUS, TYPE II (ICD-250.00) MITRAL REGURGITATION, MILD (ICD-396.3) Hx of MYOCARDIAL INFARCTION (ICD-410.90) Hx of ATRIAL FIBRILLATION WITH RAPID VENTRICULAR RESPONSE (ICD-427.31)   Hypertension Diabetes mellitus, type II Hyperlipidemia Renal insufficiency ? Stasis dermatitis 2010 Dr Terri Piedra  Social History: Last updated: 11/01/2008 Single Lives at [facility] Never Smoked Alcohol use-no Drug use-no Regular exercise-no  Review of Systems  The patient denies fever, chest pain, dyspnea on exertion, and prolonged cough.         Incontinent  Physical Exam  General:  Well-developed,well-nourished,in no acute distress; alert,appropriate and cooperative throughout examination Mouth:  Oral mucosa and oropharynx without lesions or exudates.  Teeth in good repair. Lungs:  B ronchi Heart:  RRR Abdomen:  soft, non-tender, normal bowel sounds, no distention, no masses, no guarding, no rigidity, no rebound tenderness, no abdominal hernia, no inguinal hernia, no hepatomegaly, and no splenomegaly.   Msk:  No deformity or  scoliosis noted of thoracic or lumbar spine.   Extremities:  No clubbing, cyanosis, edema, or deformity noted with normal full range of motion of all joints.   Neurologic:  alert & disoriented , cranial  nerves II-XII intact, and strength normal in all extremities.   Skin:  Large patch w/excoriations on L anter thigh Psych:  normally interactive, good eye contact, not depressed appearing, and flat affect.  memory impairment and judgment poor.     Impression & Recommendations:  Problem # 1:  DIABETES MELLITUS, TYPE II (ICD-250.00) Assessment Unchanged  Her updated medication list for this problem includes:    Amaryl 4 Mg Tabs (Glimepiride) .Marland Kitchen... 1 by mouth q am    Levemir Flexpen 100 Unit/ml Soln (Insulin detemir) .Marland Kitchen... 24 u qam and 12 u q pm  Labs Reviewed: Creat: 0.9 (07/10/2009)    Reviewed HgBA1c results: 8.2 (07/10/2009)  8.3 (03/28/2009)  Problem # 2:  RENAL INSUFFICIENCY (ICD-588.9) Assessment: Unchanged The labs were reviewed with the patient.   Problem # 3:  HYPERTENSION (ICD-401.9) Assessment: Improved  Her updated medication list for this problem includes:    Lasix 20 Mg Tabs (Furosemide) .Marland Kitchen... 1 by mouth q am  Problem # 4:  EDEMA (ICD-782.3) Assessment: Improved  Her updated medication list for this problem includes:    Lasix 20 Mg Tabs (Furosemide) .Marland Kitchen... 1 by mouth q am  Complete Medication List: 1)  Pravachol 40 Mg Tabs (Pravastatin sodium) .... Take one tablet daily 2)  Nexium 40 Mg Cpdr (Esomeprazole magnesium) .... Take one tablet daily 3)  Loratadine 10 Mg Tabs (Loratadine) .... Take one tablet daily 4)  Promethazine Hcl 25 Mg Tabs (Promethazine hcl) .... Take as needed 5)  Benefiber Powd (Wheat dextrin) .... Take daily 6)  Calcium 600 + D 600-200 Mg-unit Tabs (Calcium carbonate-vitamin d) 7)  Docusate Sodium 100 Mg Caps (Docusate sodium) .Marland Kitchen.. 1 two times a day 8)  Icaps Caps (Multiple vitamins-minerals) .... Two times a day with meals 9)  Amaryl 4 Mg Tabs (Glimepiride) .Marland Kitchen.. 1 by mouth q am 10)  Delsym 30 Mg/61ml Lqcr (Dextromethorphan polistirex) .Marland Kitchen.. 1 tsp two times a day for cough 11)  Pataday 0.2 % Soln (Olopatadine hcl) .... One drop each eye as  needed for itchy eye. 12)  Triamcinolone Acetonide 0.1 % Oint (Triamcinolone acetonide) .... Use 1-2 times a day 13)  Levemir Flexpen 100 Unit/ml Soln (Insulin detemir) .... 24 u qam and 12 u q pm 14)  Bd Autoshield 29g X 12mm Misc (Insulin pen needle) .... Use once daily as dirrected 15)  Lasix 20 Mg Tabs (Furosemide) .Marland Kitchen.. 1 by mouth q am 16)  Galantamine Hydrobromide 12 Mg Tabs (Galantamine hydrobromide) .Marland Kitchen.. 1 by mouth bid 17)  Triamcinolone Acetonide 0.5 % Oint (Triamcinolone acetonide) .... Use two times a day as needed on rash  Patient Instructions: 1)  Please schedule a follow-up appointment in 3 months. 2)  BMP prior to visit, ICD-9: 3)  HbgA1C prior to visit, ICD-9: 4)  Vit B12 782.0  995.20

## 2010-05-20 NOTE — Miscellaneous (Signed)
Summary: Certification and plan/[facility]  Certification and plan/[facility]   Imported By: Lester [place] 11/27/2009 10:47:53  _____________________________________________________________________  External Attachment:    Type:   Image     Comment:   External Document

## 2010-05-20 NOTE — Miscellaneous (Signed)
Summary: Orders/[facility]  Orders/[facility]   Imported By: Lester [place] 05/06/2009 08:09:54  _____________________________________________________________________  External Attachment:    Type:   Image     Comment:   External Document

## 2010-05-20 NOTE — Medication Information (Signed)
Summary: Quarterly Pharmacy Review/[facility]  Quarterly Pharmacy Review/[facility]   Imported By: Sherian Rein 11/20/2009 13:36:18  _____________________________________________________________________  External Attachment:    Type:   Image     Comment:   External Document

## 2010-05-20 NOTE — Assessment & Plan Note (Signed)
Summary: 3 MO ROV /NWS #   Vital Signs:  Patient profile:   77 year old female Height:      59 inches Weight:      145 pounds BMI:     29.39 Temp:     97.6 degrees F oral Pulse rate:   84 / minute Pulse rhythm:   regular Resp:     16 per minute BP sitting:   100 / 60  (left arm) Cuff size:   regular  Vitals Entered By: Lanier Prude, CMA(AAMA) (November 14, 2009 9:11 AM) CC: 3 mo f/u Is Patient Diabetic? Yes Comments pt is not taking ICAPS, Delsym, Triamcinolon cream or Oint or QC antidiarrheal   Primary Care Provider:  Tresa Garter MD  CC:  3 mo f/u.  History of Present Illness: The patient presents for a follow up of hypertension, diabetes, hyperlipidemia She had CBG 42 once  Current Medications (verified): 1)  Pravachol 40 Mg  Tabs (Pravastatin Sodium) .... Take One Tablet Daily 2)  Nexium 40 Mg  Cpdr (Esomeprazole Magnesium) .... Take One Tablet Daily 3)  Loratadine 10 Mg  Tabs (Loratadine) .... Take One Tablet Daily 4)  Promethazine Hcl 25 Mg  Tabs (Promethazine Hcl) .... Take As Needed 5)  Calcium 600 + D 600-200 Mg-Unit  Tabs (Calcium Carbonate-Vitamin D) 6)  Docusate Sodium 100 Mg  Caps (Docusate Sodium) .Marland Kitchen.. 1 Two Times A Day 7)  Icaps  Caps (Multiple Vitamins-Minerals) .... Two Times A Day With Meals 8)  Amaryl 4 Mg Tabs (Glimepiride) .Marland Kitchen.. 1 By Mouth Q Am 9)  Delsym 30 Mg/76ml Lqcr (Dextromethorphan Polistirex) .Marland Kitchen.. 1 Tsp Two Times A Day For Cough 10)  Pataday 0.2 % Soln (Olopatadine Hcl) .... One Drop Each Eye As Needed For Itchy Eye. 11)  Triamcinolone Acetonide 0.1 % Oint (Triamcinolone Acetonide) .... Use 1-2 Times A Day 12)  Levemir Flexpen 100 Unit/ml Soln (Insulin Detemir) .... 24 U Qam and 12 U Q Pm 13)  Bd Autoshield 29g X 12mm Misc (Insulin Pen Needle) .... Use Once Daily As Dirrected 14)  Lasix 20 Mg Tabs (Furosemide) .Marland Kitchen.. 1 By Mouth Q Am 15)  Galantamine Hydrobromide 12 Mg Tabs (Galantamine Hydrobromide) .Marland Kitchen.. 1 By Mouth Bid 16)  Triamcinolone  Acetonide 0.5 % Oint (Triamcinolone Acetonide) .... Use Two Times A Day As Needed On Rash 17)  Qc Anti-Diarrheal 2 Mg Tabs (Loperamide Hcl) .... Take 2 After 1st Loose  Stool, Then 1after Each Loose Stool (Max 8 Per 24hours) 18)  Qc Lo-Dose Aspirin 81 Mg Tbec (Aspirin) .... Take 1 By Mouth Once Daily  Allergies (verified): 1)  ! Penicillin  Past History:  Past Medical History: Last updated: 12/31/2008 DEMENTIA (ICD-294.8) GERD (ICD-530.81) OSTEOPOROSIS (ICD-733.00) DIABETIC  RETINOPATHY (ICD-250.50) RENAL INSUFFICIENCY, CHRONIC (ICD-585.9) DYSLIPIDEMIA (ICD-272.4) DIABETES MELLITUS, TYPE II (ICD-250.00) MITRAL REGURGITATION, MILD (ICD-396.3) Hx of MYOCARDIAL INFARCTION (ICD-410.90) Hx of ATRIAL FIBRILLATION WITH RAPID VENTRICULAR RESPONSE (ICD-427.31) PEPTIC ULCER DISEASE (ICD-533.90) DEMENTIA (ICD-294.8) GERD (ICD-530.81) OSTEOPOROSIS (ICD-733.00) DIABETIC  RETINOPATHY (ICD-250.50) RENAL INSUFFICIENCY, CHRONIC (ICD-585.9) DYSLIPIDEMIA (ICD-272.4) DIABETES MELLITUS, TYPE II (ICD-250.00) MITRAL REGURGITATION, MILD (ICD-396.3) Hx of MYOCARDIAL INFARCTION (ICD-410.90) Hx of ATRIAL FIBRILLATION WITH RAPID VENTRICULAR RESPONSE (ICD-427.31)   Hypertension Diabetes mellitus, type II Hyperlipidemia Renal insufficiency ? Stasis dermatitis 2010 Dr Terri Piedra  Social History: Last updated: 11/01/2008 Single Lives at [facility] Never Smoked Alcohol use-no Drug use-no Regular exercise-no  Review of Systems  The patient denies anorexia, fever, and weight loss.    Physical Exam  General:  alert  and overweight-appearing., frail and generally weak appearing Ears:  R ear normal and L ear normal.   Nose:  no external deformity and no nasal discharge.   Mouth:  no gingival abnormalities and pharynx pink and moist.   Neck:  supple and no masses.   Lungs:  normal respiratory effort and normal breath sounds.   Heart:  normal rate and regular rhythm.   Abdomen:  soft, normal bowel  sounds, no distention, no masses, no guarding, and no rebound tenderness but very mild lower mid abd tender it seems Msk:  No deformity or scoliosis noted of thoracic or lumbar spine.   Extremities:  no edema, no erythema  Neurologic:  alert & disoriented , cranial nerves II-XII intact, and strength normal in all extremities.   Skin:  Large patch w/excoriations on L anter thigh Psych:  normally interactive, good eye contact, not depressed appearing, and flat affect.  memory impairment and judgment poor.     Impression & Recommendations:  Problem # 1:  VITAMIN B12 DEFICIENCY (ICD-266.2) Assessment New  See "Patient Instructions".   Orders: Vit B12 1000 mcg (J3420) Admin of Therapeutic Inj  intramuscular or subcutaneous ([address])  Problem # 2:  HYPERTENSION (ICD-401.9) Assessment: Unchanged  Her updated medication list for this problem includes:    Lasix 20 Mg Tabs (Furosemide) .Marland Kitchen... 1 by mouth q am  Problem # 3:  DIABETES MELLITUS, TYPE II (ICD-250.00) Assessment: Comment Only  Her updated medication list for this problem includes:    Amaryl 4 Mg Tabs (Glimepiride) .Marland Kitchen... 1/2 by mouth q am    Levemir Flexpen 100 Unit/ml Soln (Insulin detemir) .Marland Kitchen... 22 u qam and 12 u q pm    Qc Lo-dose Aspirin 81 Mg Tbec (Aspirin) .Marland Kitchen... Take 1 by mouth once daily  Problem # 4:  HYPOGLYCEMIA (ICD-251.2) Assessment: Unchanged See "Patient Instructions".and Meds  Complete Medication List: 1)  Pravachol 40 Mg Tabs (Pravastatin sodium) .... Take one tablet daily 2)  Nexium 40 Mg Cpdr (Esomeprazole magnesium) .... Take one tablet daily 3)  Loratadine 10 Mg Tabs (Loratadine) .... Take one tablet daily 4)  Promethazine Hcl 25 Mg Tabs (Promethazine hcl) .... Take as needed 5)  Calcium 600 + D 600-200 Mg-unit Tabs (Calcium carbonate-vitamin d) 6)  Docusate Sodium 100 Mg Caps (Docusate sodium) .Marland Kitchen.. 1 two times a day 7)  Icaps Caps (Multiple vitamins-minerals) .... Two times a day with meals 8)  Amaryl 4  Mg Tabs (Glimepiride) .... 1/2 by mouth q am 9)  Delsym 30 Mg/93ml Lqcr (Dextromethorphan polistirex) .Marland Kitchen.. 1 tsp two times a day for cough 10)  Pataday 0.2 % Soln (Olopatadine hcl) .... One drop each eye as needed for itchy eye. 11)  Triamcinolone Acetonide 0.1 % Oint (Triamcinolone acetonide) .... Use 1-2 times a day 12)  Levemir Flexpen 100 Unit/ml Soln (Insulin detemir) .... 22 u qam and 12 u q pm 13)  Bd Autoshield 29g X 12mm Misc (Insulin pen needle) .... Use once daily as dirrected 14)  Lasix 20 Mg Tabs (Furosemide) .Marland Kitchen.. 1 by mouth q am 15)  Galantamine Hydrobromide 12 Mg Tabs (Galantamine hydrobromide) .Marland Kitchen.. 1 by mouth bid 16)  Triamcinolone Acetonide 0.5 % Oint (Triamcinolone acetonide) .... Use two times a day as needed on rash 17)  Qc Anti-diarrheal 2 Mg Tabs (Loperamide hcl) .... Take 2 after 1st loose  stool, then 1after each loose stool (max 8 per 24hours) 18)  Qc Lo-dose Aspirin 81 Mg Tbec (Aspirin) .... Take 1 by mouth once daily  19)  Cyanocobalamin 1000 Mcg/ml Soln (Cyanocobalamin) .Marland Kitchen.. 1 ml subcutaneously q 1 week x 2 months, then q 2 wks x 2 months then  q 1 month  Patient Instructions: 1)  Please schedule a follow-up appointment in 3 months. 2)  BMP prior to visit, ICD-9: 3)  HbgA1C prior to visit, ICD-9: 4)  CBC 5)  Vit B12 266.20  250.00 Prescriptions: CYANOCOBALAMIN 1000 MCG/ML SOLN (CYANOCOBALAMIN) 1 ml subcutaneously q 1 week x 2 months, then q 2 wks x 2 months then  q 1 month  #10 ml x 12   Entered and Authorized by:   Tresa Garter MD   Signed by:   Tresa Garter MD on 11/14/2009   Method used:   Print then Give to Patient   RxID:   [id number]    Medication Administration  Injection # 1:    Medication: Vit B12 1000 mcg    Diagnosis: VITAMIN B12 DEFICIENCY (ICD-266.2)    Route: IM    Site: R deltoid    Exp Date: 07/2011    Lot #: [id number]    Mfr: [employer]    Patient tolerated injection without complications    Given by:  Lanier Prude, CMA(AAMA) (November 14, 2009 9:52 AM)  Orders Added: 1)  Vit B12 1000 mcg [J3420] 2)  Admin of Therapeutic Inj  intramuscular or subcutaneous [96372] 3)  Est. Patient Level IV [84696]

## 2010-05-20 NOTE — Assessment & Plan Note (Signed)
Summary: 3 MOS F/U #/CD   Vital Signs:  Patient profile:   77 year old female Height:      59 inches Weight:      148 pounds BMI:     30.00 Temp:     97.8 degrees F oral Pulse rate:   68 / minute Pulse rhythm:   regular Resp:     16 per minute BP sitting:   110 / 60  (left arm) Cuff size:   regular  Vitals Entered By: Lanier Prude, CMA(AAMA) (February 14, 2010 1:25 PM) CC: 3 mo f/u   Primary Care Tinya Cadogan:  Tresa Garter MD  CC:  3 mo f/u.  History of Present Illness: The patient presents for a follow up of hypertension, diabetes, hyperlipidemia, dementia  Current Medications (verified): 1)  Pravachol 40 Mg  Tabs (Pravastatin Sodium) .... Take One Tablet Daily 2)  Nexium 40 Mg  Cpdr (Esomeprazole Magnesium) .... Take One Tablet Daily 3)  Loratadine 10 Mg  Tabs (Loratadine) .... Take One Tablet Daily 4)  Promethazine Hcl 25 Mg  Tabs (Promethazine Hcl) .... Take As Needed 5)  Calcium 600 + D 600-200 Mg-Unit  Tabs (Calcium Carbonate-Vitamin D) 6)  Docusate Sodium 100 Mg  Caps (Docusate Sodium) .Marland Kitchen.. 1 Two Times A Day 7)  Icaps  Caps (Multiple Vitamins-Minerals) .... Two Times A Day With Meals 8)  Amaryl 4 Mg Tabs (Glimepiride) .... 1/2 By Mouth Q Am 9)  Delsym 30 Mg/66ml Lqcr (Dextromethorphan Polistirex) .Marland Kitchen.. 1 Tsp Two Times A Day For Cough 10)  Pataday 0.2 % Soln (Olopatadine Hcl) .... One Drop Each Eye As Needed For Itchy Eye. 11)  Triamcinolone Acetonide 0.1 % Oint (Triamcinolone Acetonide) .... Use 1-2 Times A Day 12)  Levemir Flexpen 100 Unit/ml Soln (Insulin Detemir) .... 22 U Qam and 12 U Q Pm 13)  Bd Autoshield 29g X 12mm Misc (Insulin Pen Needle) .... Use Once Daily As Dirrected 14)  Lasix 20 Mg Tabs (Furosemide) .Marland Kitchen.. 1 By Mouth Q Am 15)  Galantamine Hydrobromide 12 Mg Tabs (Galantamine Hydrobromide) .Marland Kitchen.. 1 By Mouth Bid 16)  Triamcinolone Acetonide 0.5 % Oint (Triamcinolone Acetonide) .... Use Two Times A Day As Needed On Rash 17)  Qc Anti-Diarrheal 2 Mg Tabs  (Loperamide Hcl) .... Take 2 After 1st Loose  Stool, Then 1after Each Loose Stool (Max 8 Per 24hours) 18)  Qc Lo-Dose Aspirin 81 Mg Tbec (Aspirin) .... Take 1 By Mouth Once Daily 19)  Cyanocobalamin 1000 Mcg/ml Soln (Cyanocobalamin) .Marland Kitchen.. 1 Ml Subcutaneously Q 1 Week X 2 Months, Then Q 2 Wks X 2 Months Then  Q 1 Month  Allergies (verified): 1)  ! Penicillin  Past History:  Past Medical History: Last updated: 12/31/2008 DEMENTIA (ICD-294.8) GERD (ICD-530.81) OSTEOPOROSIS (ICD-733.00) DIABETIC  RETINOPATHY (ICD-250.50) RENAL INSUFFICIENCY, CHRONIC (ICD-585.9) DYSLIPIDEMIA (ICD-272.4) DIABETES MELLITUS, TYPE II (ICD-250.00) MITRAL REGURGITATION, MILD (ICD-396.3) Hx of MYOCARDIAL INFARCTION (ICD-410.90) Hx of ATRIAL FIBRILLATION WITH RAPID VENTRICULAR RESPONSE (ICD-427.31) PEPTIC ULCER DISEASE (ICD-533.90) DEMENTIA (ICD-294.8) GERD (ICD-530.81) OSTEOPOROSIS (ICD-733.00) DIABETIC  RETINOPATHY (ICD-250.50) RENAL INSUFFICIENCY, CHRONIC (ICD-585.9) DYSLIPIDEMIA (ICD-272.4) DIABETES MELLITUS, TYPE II (ICD-250.00) MITRAL REGURGITATION, MILD (ICD-396.3) Hx of MYOCARDIAL INFARCTION (ICD-410.90) Hx of ATRIAL FIBRILLATION WITH RAPID VENTRICULAR RESPONSE (ICD-427.31)   Hypertension Diabetes mellitus, type II Hyperlipidemia Renal insufficiency ? Stasis dermatitis 2010 Dr Terri Piedra  Social History: Last updated: 11/01/2008 Single Lives at [facility] Never Smoked Alcohol use-no Drug use-no Regular exercise-no  Physical Exam  General:  alert and overweight-appearing., frail and generally weak appearing Nose:  no external deformity and no nasal discharge.   Mouth:  no gingival abnormalities and pharynx pink and moist.   Lungs:  normal respiratory effort and normal breath sounds.   Heart:  normal rate and regular rhythm.   Abdomen:  soft, normal bowel sounds, no distention, no masses, no guarding, and no rebound tenderness but very mild lower mid abd tender it seems Msk:  No deformity or  scoliosis noted of thoracic or lumbar spine.   Neurologic:  alert & disoriented , cranial nerves II-XII intact, and strength normal in all extremities.   Skin:  Large patch w/excoriations on L anter thigh Psych:  normally interactive, good eye contact, not depressed appearing, and flat affect.  memory impairment and judgment poor.     Impression & Recommendations:  Problem # 1:  VITAMIN B12 DEFICIENCY (ICD-266.2) Assessment Improved On the regimen of medicine(s) reflected in the chart    Problem # 2:  RENAL INSUFFICIENCY (ICD-588.9) Assessment: Improved  Problem # 3:  DEMENTIA (ICD-294.8) Assessment: Unchanged On the regimen of medicine(s) reflected in the chart    Problem # 4:  DIABETES MELLITUS, TYPE II (ICD-250.00) Assessment: Unchanged The best we can do... Her updated medication list for this problem includes:    Amaryl 4 Mg Tabs (Glimepiride) .Marland Kitchen... 1/2 by mouth q am    Levemir Flexpen 100 Unit/ml Soln (Insulin detemir) .Marland Kitchen... 22 u qam and 12 u q pm    Qc Lo-dose Aspirin 81 Mg Tbec (Aspirin) .Marland Kitchen... Take 1 by mouth once daily  Labs Reviewed: Creat: 0.8 (02/07/2010)    Reviewed HgBA1c results: 7.8 (02/07/2010)  7.0 (11/07/2009)  Complete Medication List: 1)  Pravachol 40 Mg Tabs (Pravastatin sodium) .... Take one tablet daily 2)  Nexium 40 Mg Cpdr (Esomeprazole magnesium) .... Take one tablet daily 3)  Loratadine 10 Mg Tabs (Loratadine) .... Take one tablet daily 4)  Promethazine Hcl 25 Mg Tabs (Promethazine hcl) .... Take as needed 5)  Calcium 600 + D 600-200 Mg-unit Tabs (Calcium carbonate-vitamin d) 6)  Docusate Sodium 100 Mg Caps (Docusate sodium) .Marland Kitchen.. 1 two times a day 7)  Icaps Caps (Multiple vitamins-minerals) .... Two times a day with meals 8)  Amaryl 4 Mg Tabs (Glimepiride) .... 1/2 by mouth q am 9)  Delsym 30 Mg/21ml Lqcr (Dextromethorphan polistirex) .Marland Kitchen.. 1 tsp two times a day for cough 10)  Pataday 0.2 % Soln (Olopatadine hcl) .... One drop each eye as needed for  itchy eye. 11)  Triamcinolone Acetonide 0.1 % Oint (Triamcinolone acetonide) .... Use 1-2 times a day 12)  Levemir Flexpen 100 Unit/ml Soln (Insulin detemir) .... 22 u qam and 12 u q pm 13)  Bd Autoshield 29g X 12mm Misc (Insulin pen needle) .... Use once daily as dirrected 14)  Lasix 20 Mg Tabs (Furosemide) .Marland Kitchen.. 1 by mouth q am 15)  Galantamine Hydrobromide 12 Mg Tabs (Galantamine hydrobromide) .Marland Kitchen.. 1 by mouth bid 16)  Triamcinolone Acetonide 0.5 % Oint (Triamcinolone acetonide) .... Use two times a day as needed on rash 17)  Qc Anti-diarrheal 2 Mg Tabs (Loperamide hcl) .... Take 2 after 1st loose  stool, then 1after each loose stool (max 8 per 24hours) 18)  Qc Lo-dose Aspirin 81 Mg Tbec (Aspirin) .... Take 1 by mouth once daily 19)  Cyanocobalamin 1000 Mcg/ml Soln (Cyanocobalamin) .Marland Kitchen.. 1 ml subcutaneously q 1 week x 2 months, then q 2 wks x 2 months then  q 1 month  Patient Instructions: 1)  Please schedule a follow-up appointment in  4 months. 2)  BMP prior to visit, ICD-9: 3)  Hepatic Panel prior to visit, ICD-9:250.00 995.20 4)  TSH prior to visit, ICD-9: 5)  HbgA1C prior to visit, ICD-9:   Orders Added: 1)  Est. Patient Level IV [47829]   Immunization History:  Influenza Immunization History:    Influenza:  historical (01/28/2010)   Immunization History:  Influenza Immunization History:    Influenza:  Historical (01/28/2010)

## 2010-05-20 NOTE — Miscellaneous (Signed)
Summary: Referral sheet/[facility]  Referral sheet/[facility]   Imported By: Lester Hatch 11/18/2009 09:53:13  _____________________________________________________________________  External Attachment:    Type:   Image     Comment:   External Document

## 2010-05-20 NOTE — Miscellaneous (Signed)
Summary: Plan of Care & Treatment/[facility]  Plan of Care & Treatment/[facility]   Imported By: Sherian Rein 11/27/2009 10:19:47  _____________________________________________________________________  External Attachment:    Type:   Image     Comment:   External Document

## 2010-05-20 NOTE — Assessment & Plan Note (Signed)
Summary: abd distended / Plot pt / SD   Vital Signs:  Patient profile:   77 year old female Height:      59 inches Weight:      139.50 pounds O2 Sat:      95 % on Room air Temp:     98.5 degrees F oral Pulse rate:   78 / minute BP sitting:   110 / 50  (left arm) Cuff size:   regular  Vitals Entered ByZella Ball Ewing (July 19, 2009 4:10 PM)  O2 Flow:  Room air CC: Stomach distended and hard, patient confused/RE   Primary Care Provider:  Tresa Garter MD  CC:  Stomach distended and hard and patient confused/RE.  History of Present Illness: here with family, who states pt has been mild acute confused on top of her chronic dementia, unable to help with hx today;  by report pt has seemed to have abd discomfort and swelling/distension, assoc with mild increased general weakness and some increased "staggery" gait;  no fever, or apparent nausea, vomiting, dysuria, freq or urgency or hematuria, flank pain, chills, falls or injury.  Pt herself is alert, mild confused and denies pain or any complalints.  Is able to get up on the exam table with mild assist.    Problems Prior to Update: 1)  Abdominal Tenderness  (ICD-789.60) 2)  Bronchitis, Acute  (ICD-466.0) 3)  Other Nonspecific Abnormal Serum Enzyme Levels  (ICD-790.5) 4)  Rash and Other Nonspecific Skin Eruption  (ICD-782.1) 5)  Edema  (ICD-782.3) 6)  Renal Insufficiency  (ICD-588.9) 7)  Hyperlipidemia  (ICD-272.4) 8)  Tongue Disorder  (ICD-529.9) 9)  Blepharitis  (ICD-373.00) 10)  Hypertension  (ICD-401.9) 11)  Pruritus  (ICD-698.9) 12)  Chronic Kidney Disease Stage I  (ICD-585.1) 13)  Appendectomy, Hx of  (ICD-V45.79) 14)  Peptic Ulcer Disease  (ICD-533.90) 15)  Dementia  (ICD-294.8) 16)  Gerd  (ICD-530.81) 17)  Osteoporosis  (ICD-733.00) 18)  Diabetic Retinopathy  (ICD-250.50) 19)  Renal Insufficiency, Chronic  (ICD-585.9) 20)  Dyslipidemia  (ICD-272.4) 21)  Diabetes Mellitus, Type II  (ICD-250.00) 22)  Mitral  Regurgitation, Mild  (ICD-396.3) 23)  Hx of Myocardial Infarction  (ICD-410.90) 24)  Hx of Atrial Fibrillation With Rapid Ventricular Response  (ICD-427.31)  Medications Prior to Update: 1)  Pravachol 40 Mg  Tabs (Pravastatin Sodium) .... Take One Tablet Daily 2)  Nexium 40 Mg  Cpdr (Esomeprazole Magnesium) .... Take One Tablet Daily 3)  Loratadine 10 Mg  Tabs (Loratadine) .... Take One Tablet Daily 4)  Promethazine Hcl 25 Mg  Tabs (Promethazine Hcl) .... Take As Needed 5)  Benefiber   Powd (Wheat Dextrin) .... Take Daily 6)  Calcium 600 + D 600-200 Mg-Unit  Tabs (Calcium Carbonate-Vitamin D) 7)  Docusate Sodium 100 Mg  Caps (Docusate Sodium) .Marland Kitchen.. 1 Two Times A Day 8)  Icaps  Caps (Multiple Vitamins-Minerals) .... Two Times A Day With Meals 9)  Amaryl 4 Mg Tabs (Glimepiride) .Marland Kitchen.. 1 By Mouth Q Am 10)  Delsym 30 Mg/37ml Lqcr (Dextromethorphan Polistirex) .Marland Kitchen.. 1 Tsp Two Times A Day For Cough 11)  Pataday 0.2 % Soln (Olopatadine Hcl) .... One Drop Each Eye As Needed For Itchy Eye. 12)  Triamcinolone Acetonide 0.1 % Oint (Triamcinolone Acetonide) .... Use 1-2 Times A Day 13)  Levemir Flexpen 100 Unit/ml Soln (Insulin Detemir) .... 24 U Qam and 12 U Q Pm 14)  Bd Autoshield 29g X 12mm Misc (Insulin Pen Needle) .... Use Once Daily As  Dirrected 15)  Lasix 20 Mg Tabs (Furosemide) .Marland Kitchen.. 1 By Mouth Q Am 16)  Galantamine Hydrobromide 12 Mg Tabs (Galantamine Hydrobromide) .Marland Kitchen.. 1 By Mouth Bid 17)  Triamcinolone Acetonide 0.5 % Oint (Triamcinolone Acetonide) .... Use Two Times A Day As Needed On Rash  Current Medications (verified): 1)  Pravachol 40 Mg  Tabs (Pravastatin Sodium) .... Take One Tablet Daily 2)  Nexium 40 Mg  Cpdr (Esomeprazole Magnesium) .... Take One Tablet Daily 3)  Loratadine 10 Mg  Tabs (Loratadine) .... Take One Tablet Daily 4)  Promethazine Hcl 25 Mg  Tabs (Promethazine Hcl) .... Take As Needed 5)  Benefiber   Powd (Wheat Dextrin) .... Take Daily 6)  Calcium 600 + D 600-200 Mg-Unit   Tabs (Calcium Carbonate-Vitamin D) 7)  Docusate Sodium 100 Mg  Caps (Docusate Sodium) .Marland Kitchen.. 1 Two Times A Day 8)  Icaps  Caps (Multiple Vitamins-Minerals) .... Two Times A Day With Meals 9)  Amaryl 4 Mg Tabs (Glimepiride) .Marland Kitchen.. 1 By Mouth Q Am 10)  Delsym 30 Mg/37ml Lqcr (Dextromethorphan Polistirex) .Marland Kitchen.. 1 Tsp Two Times A Day For Cough 11)  Pataday 0.2 % Soln (Olopatadine Hcl) .... One Drop Each Eye As Needed For Itchy Eye. 12)  Triamcinolone Acetonide 0.1 % Oint (Triamcinolone Acetonide) .... Use 1-2 Times A Day 13)  Levemir Flexpen 100 Unit/ml Soln (Insulin Detemir) .... 24 U Qam and 12 U Q Pm 14)  Bd Autoshield 29g X 12mm Misc (Insulin Pen Needle) .... Use Once Daily As Dirrected 15)  Lasix 20 Mg Tabs (Furosemide) .Marland Kitchen.. 1 By Mouth Q Am 16)  Galantamine Hydrobromide 12 Mg Tabs (Galantamine Hydrobromide) .Marland Kitchen.. 1 By Mouth Bid 17)  Triamcinolone Acetonide 0.5 % Oint (Triamcinolone Acetonide) .... Use Two Times A Day As Needed On Rash 18)  Ciprofloxacin Hcl 500 Mg Tabs (Ciprofloxacin Hcl) .Marland Kitchen.. 1 By Mouth Two Times A Day  Allergies (verified): 1)  ! Penicillin  Past History:  Past Medical History: Last updated: 12/31/2008 DEMENTIA (ICD-294.8) GERD (ICD-530.81) OSTEOPOROSIS (ICD-733.00) DIABETIC  RETINOPATHY (ICD-250.50) RENAL INSUFFICIENCY, CHRONIC (ICD-585.9) DYSLIPIDEMIA (ICD-272.4) DIABETES MELLITUS, TYPE II (ICD-250.00) MITRAL REGURGITATION, MILD (ICD-396.3) Hx of MYOCARDIAL INFARCTION (ICD-410.90) Hx of ATRIAL FIBRILLATION WITH RAPID VENTRICULAR RESPONSE (ICD-427.31) PEPTIC ULCER DISEASE (ICD-533.90) DEMENTIA (ICD-294.8) GERD (ICD-530.81) OSTEOPOROSIS (ICD-733.00) DIABETIC  RETINOPATHY (ICD-250.50) RENAL INSUFFICIENCY, CHRONIC (ICD-585.9) DYSLIPIDEMIA (ICD-272.4) DIABETES MELLITUS, TYPE II (ICD-250.00) MITRAL REGURGITATION, MILD (ICD-396.3) Hx of MYOCARDIAL INFARCTION (ICD-410.90) Hx of ATRIAL FIBRILLATION WITH RAPID VENTRICULAR RESPONSE (ICD-427.31)   Hypertension Diabetes  mellitus, type II Hyperlipidemia Renal insufficiency ? Stasis dermatitis 2010 Dr Terri Piedra  Past Surgical History: Last updated: 12/27/2006 PEPTIC ULCER DISEASE (ICD-533.90) DEMENTIA (ICD-294.8) GERD (ICD-530.81) OSTEOPOROSIS (ICD-733.00) DIABETIC  RETINOPATHY (ICD-250.50) RENAL INSUFFICIENCY, CHRONIC (ICD-585.9) DYSLIPIDEMIA (ICD-272.4) DIABETES MELLITUS, TYPE II (ICD-250.00) MITRAL REGURGITATION, MILD (ICD-396.3) Hx of MYOCARDIAL INFARCTION (ICD-410.90) Hx of ATRIAL FIBRILLATION WITH RAPID VENTRICULAR RESPONSE (ICD-427.31) APPENDECTOMY, HX OF (ICD-V45.79) PEPTIC ULCER DISEASE (ICD-533.90) DEMENTIA (ICD-294.8) GERD (ICD-530.81) OSTEOPOROSIS (ICD-733.00) DIABETIC  RETINOPATHY (ICD-250.50) RENAL INSUFFICIENCY, CHRONIC (ICD-585.9) DYSLIPIDEMIA (ICD-272.4) DIABETES MELLITUS, TYPE II (ICD-250.00) MITRAL REGURGITATION, MILD (ICD-396.3) Hx of MYOCARDIAL INFARCTION (ICD-410.90) Hx of ATRIAL FIBRILLATION WITH RAPID VENTRICULAR RESPONSE (ICD-427.31)    Social History: Last updated: 11/01/2008 Single Lives at [facility] Never Smoked Alcohol use-no Drug use-no Regular exercise-no  Risk Factors: Exercise: no (11/01/2008)  Risk Factors: Smoking Status: never (07/17/2009)  Review of Systems       all otherwise negative per pt -  though very limited I am sure due to dementia  Physical Exam  General:  alert and overweight-appearing., frail and generally weak appearing Head:  normocephalic and atraumatic.   Eyes:  vision grossly intact, pupils equal, and pupils round.   Ears:  R ear normal and L ear normal.   Nose:  no external deformity and no nasal discharge.   Mouth:  no gingival abnormalities and pharynx pink and moist.   Neck:  supple and no masses.   Lungs:  normal respiratory effort and normal breath sounds.   Heart:  normal rate and regular rhythm.   Abdomen:  soft, normal bowel sounds, no distention, no masses, no guarding, and no rebound tenderness but very mild lower  mid abd tender it seems Extremities:  no edema, no erythema    Impression & Recommendations:  Problem # 1:  ABDOMINAL TENDERNESS (ICD-789.60)  very mild lower mid abd at best;  but with hx of acute on chronic confusion suspect early UTII;  to check urine studies, tx with cipro course  Orders: TLB-Udip w/ Micro ([id number]) T-Culture, Urine ([id number])  addendum:  pt unable to give specimen at this time;  treat as above, f/u any worsening signs or symptoms , at this time doubt diverticulitis but cant completly r/o; will hold on labs and CT given essentially benign exam but would consider with any change in symptoms  Problem # 2:  HYPERTENSION (ICD-401.9)  Her updated medication list for this problem includes:    Lasix 20 Mg Tabs (Furosemide) .Marland Kitchen... 1 by mouth q am  BP today: 110/50 Prior BP: 86/34 (07/17/2009)  Labs Reviewed: K+: 3.9 (07/10/2009) Creat: : 0.9 (07/10/2009)   Chol: 140 (07/10/2009)   HDL: 46.10 (07/10/2009)   LDL: 69 (07/10/2009)   TG: 127.0 (07/10/2009) stable overall by hx and exam, ok to continue meds/tx as is   Complete Medication List: 1)  Pravachol 40 Mg Tabs (Pravastatin sodium) .... Take one tablet daily 2)  Nexium 40 Mg Cpdr (Esomeprazole magnesium) .... Take one tablet daily 3)  Loratadine 10 Mg Tabs (Loratadine) .... Take one tablet daily 4)  Promethazine Hcl 25 Mg Tabs (Promethazine hcl) .... Take as needed 5)  Benefiber Powd (Wheat dextrin) .... Take daily 6)  Calcium 600 + D 600-200 Mg-unit Tabs (Calcium carbonate-vitamin d) 7)  Docusate Sodium 100 Mg Caps (Docusate sodium) .Marland Kitchen.. 1 two times a day 8)  Icaps Caps (Multiple vitamins-minerals) .... Two times a day with meals 9)  Amaryl 4 Mg Tabs (Glimepiride) .Marland Kitchen.. 1 by mouth q am 10)  Delsym 30 Mg/2ml Lqcr (Dextromethorphan polistirex) .Marland Kitchen.. 1 tsp two times a day for cough 11)  Pataday 0.2 % Soln (Olopatadine hcl) .... One drop each eye as needed for itchy eye. 12)  Triamcinolone Acetonide 0.1 % Oint  (Triamcinolone acetonide) .... Use 1-2 times a day 13)  Levemir Flexpen 100 Unit/ml Soln (Insulin detemir) .... 24 u qam and 12 u q pm 14)  Bd Autoshield 29g X 12mm Misc (Insulin pen needle) .... Use once daily as dirrected 15)  Lasix 20 Mg Tabs (Furosemide) .Marland Kitchen.. 1 by mouth q am 16)  Galantamine Hydrobromide 12 Mg Tabs (Galantamine hydrobromide) .Marland Kitchen.. 1 by mouth bid 17)  Triamcinolone Acetonide 0.5 % Oint (Triamcinolone acetonide) .... Use two times a day as needed on rash 18)  Ciprofloxacin Hcl 500 Mg Tabs (Ciprofloxacin hcl) .Marland Kitchen.. 1 by mouth two times a day  Patient Instructions: 1)  Please take all new medications as prescribed - the ciprofloxocin antibiotic (this was sent to your pharmacy in [place]) 2)  Continue all previous medications as  before this visit  3)  please make appt with Dr Posey Rea if symptoms persist or worsen 4)  Otherwise, please keep your appt's with Dr Posey Rea as he has suggested in the past Prescriptions: CIPROFLOXACIN HCL 500 MG TABS (CIPROFLOXACIN HCL) 1 by mouth two times a day  #20 x 0   Entered and Authorized by:   Corwin Levins MD   Signed by:   Corwin Levins MD on 07/19/2009   Method used:   Electronically to        [facility] (retail)       [address]       [place], [place]  [address]       Ph: [phone]       Fax: [phone]   RxID:   [id number]

## 2010-05-20 NOTE — Miscellaneous (Signed)
Summary: Order/[facility]  Order/[facility]   Imported By: Lester Meriwether 09/26/2009 10:57:26  _____________________________________________________________________  External Attachment:    Type:   Image     Comment:   External Document

## 2010-05-20 NOTE — Miscellaneous (Signed)
Summary: Plan of Care & Treatment/[facility]  Plan of Care & Treatment/[facility]   Imported By: Sherian Rein 06/24/2009 08:54:45  _____________________________________________________________________  External Attachment:    Type:   Image     Comment:   External Document

## 2010-05-20 NOTE — Miscellaneous (Signed)
Summary: PT Eval Order/[facility]  PT Eval Order/[facility]   Imported By: Sherian Rein 07/17/2009 10:21:42  _____________________________________________________________________  External Attachment:    Type:   Image     Comment:   External Document

## 2010-05-20 NOTE — Miscellaneous (Signed)
Summary: SN orders/[facility]  SN orders/[facility]   Imported By: Sherian Rein 07/24/2009 10:00:47  _____________________________________________________________________  External Attachment:    Type:   Image     Comment:   External Document

## 2010-05-20 NOTE — Miscellaneous (Signed)
Summary: Patient's eye/[facility]  Patient's eye/[facility]   Imported By: Sherian Rein 12/25/2009 14:58:25  _____________________________________________________________________  External Attachment:    Type:   Image     Comment:   External Document

## 2010-05-20 NOTE — Miscellaneous (Signed)
Summary: Plan of Care & Treatment/ [facility]  Plan of Care & Treatment/ [facility]   Imported By: Lennie Odor 08/02/2009 14:11:24  _____________________________________________________________________  External Attachment:    Type:   Image     Comment:   External Document

## 2010-05-20 NOTE — Miscellaneous (Signed)
Summary: Physicians Verbal Order / [facility]  Physicians Verbal Order / [facility]   Imported By: Lennie Odor 08/21/2009 09:24:23  _____________________________________________________________________  External Attachment:    Type:   Image     Comment:   External Document

## 2010-05-20 NOTE — Miscellaneous (Signed)
Summary: Treatment order/[facility]  Treatment order/[facility]   Imported By: Sherian Rein 09/30/2009 07:55:19  _____________________________________________________________________  External Attachment:    Type:   Image     Comment:   External Document

## 2010-05-20 NOTE — Progress Notes (Signed)
Summary: Benefiber refill  Phone Note Refill Request Message from:  Fax from Pharmacy on August 08, 2009 4:16 PM  Refills Requested: Medication #1:  BENEFIBER   POWD (WHEAT DEXTRIN) SUGAR FREE mix 2 teaspoonsful in 48 ounces of water and drink daily Initial call taken by: Lucious Groves,  August 08, 2009 4:17 PM    Prescriptions: BENEFIBER   POWD (WHEAT DEXTRIN) SUGAR FREE mix 2 teaspoonsful in 48 ounces of water and drink daily  #245gm x 12   Entered by:   Lucious Groves   Authorized by:   Tresa Garter MD   Signed by:   Lucious Groves on 08/08/2009   Method used:   Faxed to ...       [facility] (retail)       [address]       [place], [place]  [address]       Ph: [phone]       Fax: [phone]   RxID:   [id number]

## 2010-05-20 NOTE — Progress Notes (Signed)
Summary: verbal order request  Phone Note From Other Clinic   Caller: Delories Heinz [phone] Summary of Call: Irving Burton called for verbal order to d/c patient from care. Per Irving Burton patient is at [facility] and wounds are all healed up. Is ok to give verbal d/c? Initial call taken by: Lucious Groves,  June 19, 2009 2:15 PM  Follow-up for Phone Call        OK Thx Follow-up by: Tresa Garter MD,  June 19, 2009 4:06 PM  Additional Follow-up for Phone Call Additional follow up Details #1::        Left message on voicemail notifying,. Additional Follow-up by: Lucious Groves,  June 19, 2009 4:18 PM

## 2010-05-20 NOTE — Miscellaneous (Signed)
Summary: D/C order/[facility]  D/C order/[facility]   Imported By: Sherian Rein 09/30/2009 07:53:28  _____________________________________________________________________  External Attachment:    Type:   Image     Comment:   External Document

## 2010-05-20 NOTE — Miscellaneous (Signed)
Summary: icaps lutein  Clinical Lists Changes  Medications: Rx of ICAPS  CAPS (MULTIPLE VITAMINS-MINERALS) two times a day with meals;  #60 x 10;  Signed;  Entered by: Tora Perches;  Authorized by: Tresa Garter MD;  Method used: Faxed to [facility] [address], [place], [place], [place]  [address], Ph: [phone], Fax: [phone]    Prescriptions: ICAPS  CAPS (MULTIPLE VITAMINS-MINERALS) two times a day with meals  #60 x 10   Entered by:   Tora Perches   Authorized by:   Tresa Garter MD   Signed by:   Tora Perches on 06/05/2009   Method used:   Faxed to ...       [facility] (retail)       [address]       [place], [place]  [address]       Ph: [phone]       Fax: [phone]   RxID:   [id number]

## 2010-05-20 NOTE — Progress Notes (Signed)
Summary: docusate sodium  Phone Note Other Incoming Call back at fax   Caller: [facility] Details for Reason: refill on docusate sodium Details of Action Taken: ok x10 refills/vg Initial call taken by: Tora Perches,  June 17, 2009 1:45 PM    Prescriptions: DOCUSATE SODIUM 100 MG  CAPS (DOCUSATE SODIUM) 1 two times a day  #60 x 10   Entered by:   Tora Perches   Authorized by:   Tresa Garter MD   Signed by:   Tora Perches on 06/17/2009   Method used:   Electronically to        [facility] (retail)       [address]       [place], [place]  [address]       Ph: [phone]       Fax: [phone]   RxID:   [id number]

## 2010-05-20 NOTE — Miscellaneous (Signed)
Summary: [facility]   Imported By: Lester [place] 06/03/2009 10:32:49  _____________________________________________________________________  External Attachment:    Type:   Image     Comment:   External Document

## 2010-05-20 NOTE — Progress Notes (Signed)
Summary: Anti-diarrheal  Phone Note From Pharmacy   Caller: [facility] Reason for Call: Needs renewal Request: Speak with Provider Summary of Call: Pt is reg refill on QC Anti-Diarrheal 2mg  # 24 take 2 caplets after 1st loose stool, then 1 after each loose stool. (Max 8 per 24 hrs). Med is not on med list. Is this ok to refill? Initial call taken by: Orlan Leavens,  July 24, 2009 11:51 AM  Follow-up for Phone Call        OK Thx Follow-up by: Tresa Garter MD,  July 24, 2009 1:15 PM  Additional Follow-up for Phone Call Additional follow up Details #1::        sent refill Additional Follow-up by: Orlan Leavens,  July 24, 2009 2:58 PM    New/Updated Medications: QC ANTI-DIARRHEAL 2 MG TABS (LOPERAMIDE HCL) take 2 after 1st loose  stool, then 1after each loose stool (max 8 per 24hours) Prescriptions: QC ANTI-DIARRHEAL 2 MG TABS (LOPERAMIDE HCL) take 2 after 1st loose  stool, then 1after each loose stool (max 8 per 24hours)  #24 x 0   Entered by:   Orlan Leavens   Authorized by:   Tresa Garter MD   Signed by:   Orlan Leavens on 07/24/2009   Method used:   Electronically to        [facility] (retail)       [address]       [place], [place]  [address]       Ph: [phone]       Fax: [phone]   RxID:   [id number]

## 2010-05-20 NOTE — Miscellaneous (Signed)
Summary: Physician Order/[facility]  Physician Order/[facility]   Imported By: Sherian Rein 11/27/2009 10:21:06  _____________________________________________________________________  External Attachment:    Type:   Image     Comment:   External Document

## 2010-05-20 NOTE — Progress Notes (Signed)
Summary: Benefiber  Phone Note From Pharmacy   Caller: [facility] Summary of Call: Request for Benefiber. Cannot send un-coded meds electronically, printed for signature/fax Initial call taken by: Lucious Groves,  July 30, 2009 9:34 AM    New/Updated Medications: * BENEFIBER   POWD (WHEAT DEXTRIN) SUGAR FREE mix 2 teaspoonsful in 48 ounces of water and drink daily Prescriptions: BENEFIBER   POWD (WHEAT DEXTRIN) SUGAR FREE mix 2 teaspoonsful in 48 ounces of water and drink daily  #245gm x 0   Entered by:   Lucious Groves   Authorized by:   Tresa Garter MD   Signed by:   Lucious Groves on 07/30/2009   Method used:   Printed then faxed to ...       [facility] (retail)       [address]       [place], [place]  [address]       Ph: [phone]       Fax: [phone]   RxID:   [id number]

## 2010-05-20 NOTE — Miscellaneous (Signed)
Summary: Physician's Order for Levemir Flexpen/[facility]  Physician's Order for Levemir Flexpen/[facility]   Imported By: Sherian Rein 04/29/2009 12:59:24  _____________________________________________________________________  External Attachment:    Type:   Image     Comment:   External Document

## 2010-05-20 NOTE — Letter (Signed)
Summary: CMN for Diabetes Supplies/[facility]  CMN for Diabetes Supplies/[facility]   Imported By: Sherian Rein 12/30/2009 11:45:58  _____________________________________________________________________  External Attachment:    Type:   Image     Comment:   External Document

## 2010-05-20 NOTE — Medication Information (Signed)
Summary: Medications Order/[facility]  Medications Order/[facility]   Imported By: Sherian Rein 07/26/2009 13:22:20  _____________________________________________________________________  External Attachment:    Type:   Image     Comment:   External Document

## 2010-05-20 NOTE — Miscellaneous (Signed)
Summary: Face to Face Encounter/[facility]  Face to Face Encounter/[facility]   Imported By: Sherian Rein 12/05/2009 10:07:34  _____________________________________________________________________  External Attachment:    Type:   Image     Comment:   External Document

## 2010-05-20 NOTE — Progress Notes (Signed)
Summary: Refill--Furosemide  Phone Note Refill Request Message from:  Fax from Pharmacy on December 17, 2009 8:55 AM  Refills Requested: Medication #1:  LASIX 20 MG TABS 1 by mouth q am Initial call taken by: Lucious Groves CMA,  December 17, 2009 8:55 AM    Prescriptions: LASIX 20 MG TABS (FUROSEMIDE) 1 by mouth q am  #30 x 3   Entered by:   Lucious Groves CMA   Authorized by:   Tresa Garter MD   Signed by:   Lucious Groves CMA on 12/17/2009   Method used:   Faxed to ...       [facility] (retail)       [address]       [place], [place]  [address]       Ph: [phone]       Fax: [phone]   RxID:   [id number]

## 2010-05-20 NOTE — Miscellaneous (Signed)
Summary: Order/[facility]  Order/[facility]   Imported By: Lester [place] 12/05/2009 10:29:05  _____________________________________________________________________  External Attachment:    Type:   Image     Comment:   External Document

## 2010-05-20 NOTE — Letter (Signed)
Summary: Pull[facility]  [facility]   Imported By: Sherian Rein 09/24/2009 12:16:00  _____________________________________________________________________  External Attachment:    Type:   Image     Comment:   External Document

## 2010-05-20 NOTE — Miscellaneous (Signed)
Summary: D/C Medication orders/[facility]  D/C Medication orders/[facility]   Imported By: Sherian Rein 02/24/2010 07:57:28  _____________________________________________________________________  External Attachment:    Type:   Image     Comment:   External Document

## 2010-05-20 NOTE — Miscellaneous (Signed)
Summary: Order for UA/[facility]  Order for UA/[facility]   Imported By: Sherian Rein 09/24/2009 12:51:59  _____________________________________________________________________  External Attachment:    Type:   Image     Comment:   External Document

## 2010-05-20 NOTE — Progress Notes (Signed)
Summary: CALL BACK   Phone Note From Other Clinic   Summary of Call: Someone called from [facility] [phone] req a call back.  Initial call taken by: Lamar Sprinkles, CMA,  July 19, 2009 11:57 AM  Follow-up for Phone Call        Spoke w/ nurse at [facility] place. They are concerned b/c patient's abdomen is distended and hard. Pt has has some increase in confusion. No c/o pain. Spoke w/DR, pt should be seen by avail MD. Pt's daughter will pick her up and bring her here for 4:15 office visit w/Dr Jonny Ruiz.  Follow-up by: Lamar Sprinkles, CMA,  July 19, 2009 3:23 PM  Additional Follow-up for Phone Call Additional follow up Details #1::        Noted. Agree. Thx Additional Follow-up by: Tresa Garter MD,  July 19, 2009 5:24 PM

## 2010-05-20 NOTE — Miscellaneous (Signed)
Summary: FL-2 and orders/[facility]  FL-2 and orders/[facility]   Imported By: Lester [place] 11/27/2009 10:49:25  _____________________________________________________________________  External Attachment:    Type:   Image     Comment:   External Document

## 2010-05-22 NOTE — Miscellaneous (Signed)
Summary: Order/[facility]  Order/[facility]   Imported By: Lester [place] 05/13/2010 11:44:12  _____________________________________________________________________  External Attachment:    Type:   Image     Comment:   External Document

## 2010-05-22 NOTE — Progress Notes (Signed)
  Phone Note Refill Request Message from:  Fax from Pharmacy on May 01, 2010 10:08 AM  Refills Requested: Medication #1:  PRAVACHOL 40 MG  TABS take one tablet daily Initial call taken by: Rock Nephew CMA,  May 01, 2010 10:08 AM    Prescriptions: PRAVACHOL 40 MG  TABS (PRAVASTATIN SODIUM) take one tablet daily  #30 x 6   Entered by:   Rock Nephew CMA   Authorized by:   Tresa Garter MD   Signed by:   Rock Nephew CMA on 05/01/2010   Method used:   Electronically to        [facility] (retail)       [address]       [place], [place]  [address]       Ph: [phone]       Fax: [phone]   RxID:   [id number]

## 2010-05-22 NOTE — Miscellaneous (Signed)
Summary: Care Plans/[facility]  Care Plans/[facility]   Imported By: Sherian Rein 04/30/2010 11:49:51  _____________________________________________________________________  External Attachment:    Type:   Image     Comment:   External Document

## 2010-05-28 NOTE — Miscellaneous (Signed)
Summary: Care Plans/[facility]  Care Plans/[facility]   Imported By: Sherian Rein 05/23/2010 10:21:09  _____________________________________________________________________  External Attachment:    Type:   Image     Comment:   External Document

## 2010-06-02 ENCOUNTER — Encounter: Payer: Self-pay | Admitting: Internal Medicine

## 2010-06-03 ENCOUNTER — Encounter: Payer: Self-pay | Admitting: Internal Medicine

## 2010-06-08 ENCOUNTER — Encounter: Payer: Self-pay | Admitting: Internal Medicine

## 2010-06-11 NOTE — Miscellaneous (Signed)
Summary: Order/[facility]  Order/[facility]   Imported By: Lester [place] 06/06/2010 07:45:32  _____________________________________________________________________  External Attachment:    Type:   Image     Comment:   External Document

## 2010-06-11 NOTE — Miscellaneous (Signed)
Summary: Order request to crush meds/[facility]  Order request to crush meds/[facility]   Imported By: Sherian Rein 06/05/2010 11:05:25  _____________________________________________________________________  External Attachment:    Type:   Image     Comment:   External Document

## 2010-06-16 ENCOUNTER — Other Ambulatory Visit: Payer: Medicare Other

## 2010-06-17 NOTE — Letter (Signed)
Summary: CMN for Diabetic Supplies / [facility]  CMN for Diabetic Supplies / [facility]   Imported By: Lennie Odor 06/11/2010 10:50:36  _____________________________________________________________________  External Attachment:    Type:   Image     Comment:   External Document

## 2010-06-23 ENCOUNTER — Ambulatory Visit: Payer: Self-pay | Admitting: Internal Medicine

## 2010-06-25 ENCOUNTER — Encounter: Payer: Self-pay | Admitting: Internal Medicine

## 2010-06-25 ENCOUNTER — Telehealth: Payer: Self-pay | Admitting: Internal Medicine

## 2010-06-25 ENCOUNTER — Encounter (INDEPENDENT_AMBULATORY_CARE_PROVIDER_SITE_OTHER): Payer: Self-pay | Admitting: *Deleted

## 2010-06-25 ENCOUNTER — Other Ambulatory Visit: Payer: Self-pay | Admitting: Internal Medicine

## 2010-06-25 ENCOUNTER — Other Ambulatory Visit: Payer: Medicare Other

## 2010-06-25 DIAGNOSIS — T887XXA Unspecified adverse effect of drug or medicament, initial encounter: Secondary | ICD-10-CM

## 2010-06-25 DIAGNOSIS — Z79899 Other long term (current) drug therapy: Secondary | ICD-10-CM

## 2010-06-25 DIAGNOSIS — E119 Type 2 diabetes mellitus without complications: Secondary | ICD-10-CM

## 2010-06-25 LAB — BASIC METABOLIC PANEL
BUN: 21 mg/dL (ref 6–23)
CO2: 30 mEq/L (ref 19–32)
Chloride: 102 mEq/L (ref 96–112)
Glucose, Bld: 314 mg/dL — ABNORMAL HIGH (ref 70–99)
Potassium: 4.5 mEq/L (ref 3.5–5.1)

## 2010-06-25 LAB — HEPATIC FUNCTION PANEL
ALT: 20 U/L (ref 0–35)
AST: 16 U/L (ref 0–37)
Albumin: 3.5 g/dL (ref 3.5–5.2)
Total Bilirubin: 0.5 mg/dL (ref 0.3–1.2)

## 2010-06-25 LAB — TSH: TSH: 4.09 u[IU]/mL (ref 0.35–5.50)

## 2010-06-27 ENCOUNTER — Encounter: Payer: Self-pay | Admitting: Internal Medicine

## 2010-06-30 ENCOUNTER — Encounter: Payer: Self-pay | Admitting: Internal Medicine

## 2010-06-30 ENCOUNTER — Ambulatory Visit (INDEPENDENT_AMBULATORY_CARE_PROVIDER_SITE_OTHER): Payer: Medicare Other | Admitting: Internal Medicine

## 2010-06-30 DIAGNOSIS — E119 Type 2 diabetes mellitus without complications: Secondary | ICD-10-CM

## 2010-06-30 DIAGNOSIS — F068 Other specified mental disorders due to known physiological condition: Secondary | ICD-10-CM

## 2010-06-30 DIAGNOSIS — I10 Essential (primary) hypertension: Secondary | ICD-10-CM

## 2010-07-01 NOTE — Progress Notes (Signed)
Summary: ELEVATED CBG  Phone Note From Other Clinic   Summary of Call: [facility] called- They will be faxing also. Pt's cbg this am was 439 (fasting) normal levemir dose was administered and cbg after breakfast was 467. This is very unusual for patient. She  has no complaints this am, feeling well overall.   Our med correctly reflects what patient is currently taking.  Initial call taken by: Lamar Sprinkles, CMA,  June 25, 2010 9:55 AM  Follow-up for Phone Call        Needs to have a U/A, empirically start  cipro 250 two times a day x 5 days after urine collected.  Follow-up by: Jacques Navy MD,  June 25, 2010 10:04 AM  Additional Follow-up for Phone Call Additional follow up Details #1::        Order faxed Additional Follow-up by: Lamar Sprinkles, CMA,  June 25, 2010 10:29 AM    Additional Follow-up for Phone Call Additional follow up Details #2::    Change Amaryl to 1 a day if she is on 1/2 tab a day Follow-up by: Tresa Garter MD,  June 26, 2010 5:26 PM  New/Updated Medications: AMARYL 4 MG TABS (GLIMEPIRIDE) 1 by mouth q am DELSYM 30 MG/5ML LQCR (DEXTROMETHORPHAN POLISTIREX) 1 tsp two times a day for cough CIPRO 250 MG TABS (CIPROFLOXACIN HCL) two times a day x 5 days Prescriptions: AMARYL 4 MG TABS (GLIMEPIRIDE) 1 by mouth q am  #90 x 1   Entered by:   Lamar Sprinkles, CMA   Authorized by:   Tresa Garter MD   Signed by:   Lamar Sprinkles, CMA on 06/26/2010   Method used:   Electronically to        [facility] (retail)       [address]       [place], [place]  [address]       Ph: [phone]       Fax: [phone]   RxID:   [id number]

## 2010-07-02 ENCOUNTER — Telehealth (INDEPENDENT_AMBULATORY_CARE_PROVIDER_SITE_OTHER): Payer: Self-pay | Admitting: *Deleted

## 2010-07-07 ENCOUNTER — Encounter: Payer: Self-pay | Admitting: Internal Medicine

## 2010-07-08 NOTE — Progress Notes (Signed)
Summary: fax order  Phone Note Other Incoming   Caller: Britta Mccreedy at [facility] [phone] Summary of Call: Caller states that they recvd order to titer Levimir 1 unit AM/1 unit PM each day until BS levels are between 100/140. Caller states that orders did not state up or down & pharmacy will not allow until this has been completed. Caller will re-fax order back to [facility] to be completed. Caller can be reached on Mobile also at [phone] if any further assistance needed. Initial call taken by: Burnard Leigh [facility]),  July 02, 2010 4:55 PM  Follow-up for Phone Call        Form corrected and refaxed. Follow-up by: Burnard Leigh [facility]),  July 02, 2010 6:52 PM

## 2010-07-08 NOTE — Miscellaneous (Signed)
Summary: Bruises/[facility]  Bruises/[facility]   Imported By: Sherian Rein 07/01/2010 12:38:57  _____________________________________________________________________  External Attachment:    Type:   Image     Comment:   External Document

## 2010-07-08 NOTE — Assessment & Plan Note (Signed)
Summary: 4 MO FU /NWS   Vital Signs:  Patient profile:   77 year old female Height:      59 inches (149.86 cm) Weight:      154.75 pounds (70.34 kg) BMI:     31.37 O2 Sat:      93 % on Room air Temp:     98.2 degrees F (36.78 degrees C) oral Pulse rate:   111 / minute Resp:     14 per minute BP sitting:   98 / 56  (left arm) Cuff size:   regular  Vitals Entered By: Burnard Leigh CMA(AAMA) (June 30, 2010 2:25 PM)  O2 Flow:  Room air CC: 63-month F/U.sls,cma Is Patient Diabetic? Yes Comments Pt (caregiver) states that more easily bruising and macular degeneration seems to be progressing (Dr. Fawn Kirk oversees) Pt request refill for Pataday   Primary Care Provider:  Tresa Garter MD  CC:  45-month F/U.sls and cma.  History of Present Illness: The patient presents for a follow up of hypertension, diabetes, hyperlipidemia C/o CBGs over 300s  Current Medications (verified): 1)  Pravachol 40 Mg  Tabs (Pravastatin Sodium) .... Take One Tablet Daily 2)  Nexium 40 Mg  Cpdr (Esomeprazole Magnesium) .... Take One Tablet Daily 3)  Loratadine 10 Mg  Tabs (Loratadine) .... Take One Tablet Daily 4)  Promethazine Hcl 25 Mg  Tabs (Promethazine Hcl) .... Take As Needed 5)  Calcium 600 + D 600-200 Mg-Unit  Tabs (Calcium Carbonate-Vitamin D) 6)  Docusate Sodium 100 Mg  Caps (Docusate Sodium) .Marland Kitchen.. 1 Two Times A Day 7)  Icaps  Caps (Multiple Vitamins-Minerals) .... Two Times A Day With Meals 8)  Amaryl 4 Mg Tabs (Glimepiride) .Marland Kitchen.. 1 By Mouth Q Am 9)  Delsym 30 Mg/38ml Lqcr (Dextromethorphan Polistirex) .Marland Kitchen.. 1 Tsp Two Times A Day For Cough 10)  Pataday 0.2 % Soln (Olopatadine Hcl) .... One Drop Each Eye As Needed For Itchy Eye. 11)  Triamcinolone Acetonide 0.1 % Oint (Triamcinolone Acetonide) .... Use 1-2 Times A Day 12)  Levemir Flexpen 100 Unit/ml Soln (Insulin Detemir) .... 22 U Qam and 12 U Q Pm 13)  Bd Autoshield 29g X 12mm Misc (Insulin Pen Needle) .... Use Once Daily As  Dirrected 14)  Lasix 20 Mg Tabs (Furosemide) .Marland Kitchen.. 1 By Mouth Q Am 15)  Galantamine Hydrobromide 12 Mg Tabs (Galantamine Hydrobromide) .Marland Kitchen.. 1 By Mouth Bid 16)  Triamcinolone Acetonide 0.5 % Oint (Triamcinolone Acetonide) .... Use Two Times A Day As Needed On Rash 17)  Qc Anti-Diarrheal 2 Mg Tabs (Loperamide Hcl) .... Take 2 After 1st Loose  Stool, Then 1after Each Loose Stool (Max 8 Per 24hours) As Needed 18)  Qc Lo-Dose Aspirin 81 Mg Tbec (Aspirin) .... Take 1 By Mouth Once Daily 19)  Cyanocobalamin 1000 Mcg/ml Soln (Cyanocobalamin) .Marland Kitchen.. 1 Ml Subcutaneously Q 1 Week X 2 Months, Then Q 2 Wks X 2 Months Then  Q 1 Month  Allergies (verified): 1)  ! Penicillin  Past History:  Past Medical History: Last updated: 12/31/2008 DEMENTIA (ICD-294.8) GERD (ICD-530.81) OSTEOPOROSIS (ICD-733.00) DIABETIC  RETINOPATHY (ICD-250.50) RENAL INSUFFICIENCY, CHRONIC (ICD-585.9) DYSLIPIDEMIA (ICD-272.4) DIABETES MELLITUS, TYPE II (ICD-250.00) MITRAL REGURGITATION, MILD (ICD-396.3) Hx of MYOCARDIAL INFARCTION (ICD-410.90) Hx of ATRIAL FIBRILLATION WITH RAPID VENTRICULAR RESPONSE (ICD-427.31) PEPTIC ULCER DISEASE (ICD-533.90) DEMENTIA (ICD-294.8) GERD (ICD-530.81) OSTEOPOROSIS (ICD-733.00) DIABETIC  RETINOPATHY (ICD-250.50) RENAL INSUFFICIENCY, CHRONIC (ICD-585.9) DYSLIPIDEMIA (ICD-272.4) DIABETES MELLITUS, TYPE II (ICD-250.00) MITRAL REGURGITATION, MILD (ICD-396.3) Hx of MYOCARDIAL INFARCTION (ICD-410.90) Hx of ATRIAL FIBRILLATION WITH RAPID  VENTRICULAR RESPONSE (ICD-427.31)   Hypertension Diabetes mellitus, type II Hyperlipidemia Renal insufficiency ? Stasis dermatitis 2010 Dr Terri Piedra  Social History: Last updated: 11/01/2008 Single Lives at [facility] Never Smoked Alcohol use-no Drug use-no Regular exercise-no  Review of Systems  The patient denies anorexia, fever, chest pain, and abdominal pain.    Physical Exam  General:  alert and overweight-appearing., frail and generally weak  appearing Ears:  R ear normal and L ear normal.   Mouth:  no gingival abnormalities and pharynx pink and moist.   Neck:  supple and no masses.   Lungs:  normal respiratory effort and normal breath sounds.   Heart:  normal rate and regular rhythm.   Abdomen:  soft, normal bowel sounds, no distention, no masses, no guarding, and no rebound tenderness but very mild lower mid abd tender it seems Msk:  No deformity or scoliosis noted of thoracic or lumbar spine.   Neurologic:  alert & disoriented , cranial nerves II-XII intact, and strength normal in all extremities.   Skin:  aging changes Psych:  normally interactive, good eye contact, not depressed appearing, and flat affect.  memory impairment and judgment poor.     Impression & Recommendations:  Problem # 1:  DIABETES MELLITUS, TYPE II (ICD-250.00) Assessment Deteriorated See "Patient Instructions".  Her updated medication list for this problem includes:    Amaryl 4 Mg Tabs (Glimepiride) .Marland Kitchen... 1 by mouth q am    Levemir Flexpen 100 Unit/ml Soln (Insulin detemir) .Marland Kitchen... 22 u qam and 12 u q pm    Qc Lo-dose Aspirin 81 Mg Tbec (Aspirin) .Marland Kitchen... Take 1 by mouth once daily  Problem # 2:  HYPOGLYCEMIA (ICD-251.2) Assessment: Improved  Problem # 3:  VITAMIN B12 DEFICIENCY (ICD-266.2) Assessment: Unchanged On the regimen of medicine(s) reflected in the chart    Problem # 4:  HYPERLIPIDEMIA (ICD-272.4) Assessment: Unchanged  Her updated medication list for this problem includes:    Pravachol 40 Mg Tabs (Pravastatin sodium) .Marland Kitchen... Take one tablet daily  Problem # 5:  DEMENTIA (ICD-294.8) Assessment: Unchanged On the regimen of medicine(s) reflected in the chart    Problem # 6:  HYPERTENSION (ICD-401.9) Assessment: Improved  Her updated medication list for this problem includes:    Lasix 20 Mg Tabs (Furosemide) .Marland Kitchen... 1 by mouth q am  Complete Medication List: 1)  Pravachol 40 Mg Tabs (Pravastatin sodium) .... Take one tablet daily 2)   Nexium 40 Mg Cpdr (Esomeprazole magnesium) .... Take one tablet daily 3)  Loratadine 10 Mg Tabs (Loratadine) .... Take one tablet daily 4)  Promethazine Hcl 25 Mg Tabs (Promethazine hcl) .... Take as needed 5)  Calcium 600 + D 600-200 Mg-unit Tabs (Calcium carbonate-vitamin d) 6)  Docusate Sodium 100 Mg Caps (Docusate sodium) .Marland Kitchen.. 1 two times a day 7)  Icaps Caps (Multiple vitamins-minerals) .... Two times a day with meals 8)  Amaryl 4 Mg Tabs (Glimepiride) .Marland Kitchen.. 1 by mouth q am 9)  Delsym 30 Mg/50ml Lqcr (Dextromethorphan polistirex) .Marland Kitchen.. 1 tsp two times a day for cough 10)  Pataday 0.2 % Soln (Olopatadine hcl) .... One drop each eye as needed for itchy eye. 11)  Triamcinolone Acetonide 0.1 % Oint (Triamcinolone acetonide) .... Use 1-2 times a day 12)  Levemir Flexpen 100 Unit/ml Soln (Insulin detemir) .... 22 u qam and 12 u q pm 13)  Bd Autoshield 29g X 12mm Misc (Insulin pen needle) .... Use once daily as dirrected 14)  Lasix 20 Mg Tabs (Furosemide) .Marland Kitchen.. 1 by  mouth q am 15)  Galantamine Hydrobromide 12 Mg Tabs (Galantamine hydrobromide) .Marland Kitchen.. 1 by mouth bid 16)  Triamcinolone Acetonide 0.5 % Oint (Triamcinolone acetonide) .... Use two times a day as needed on rash 17)  Qc Anti-diarrheal 2 Mg Tabs (Loperamide hcl) .... Take 2 after 1st loose  stool, then 1after each loose stool (max 8 per 24hours) as needed 18)  Qc Lo-dose Aspirin 81 Mg Tbec (Aspirin) .... Take 1 by mouth once daily 19)  Cyanocobalamin 1000 Mcg/ml Soln (Cyanocobalamin) .Marland Kitchen.. 1 ml subcutaneously  q 1 month  Patient Instructions: 1)  Titrate Levemir by 1 unit in am and 1 unit in pm  (max 2 units a day total) for goal CBGs 100-140 2)  Please schedule a follow-up appointment in 1 month.   Orders Added: 1)  Est. Patient Level IV [[address]]

## 2010-07-17 NOTE — Miscellaneous (Signed)
Summary: Physician's Orders / [facility]  Physician's Orders / [facility]   Imported By: Lennie Odor 07/07/2010 11:56:56  _____________________________________________________________________  External Attachment:    Type:   Image     Comment:   External Document

## 2010-07-17 NOTE — Miscellaneous (Signed)
Summary: Nystatin-Triamcinolone  Clinical Lists Changes  Medications: Added new medication of NYSTATIN-TRIAMCINOLONE 100000-0.1 UNIT/GM-% CREA (NYSTATIN-TRIAMCINOLONE) Apply to Left and Right inner buttock twice daily as directed - Signed Rx of NYSTATIN-TRIAMCINOLONE 100000-0.1 UNIT/GM-% CREA (NYSTATIN-TRIAMCINOLONE) Apply to Left and Right inner buttock twice daily as directed;  #30 gm x 1;  Signed;  Entered by: Lanier Prude, CMA(AAMA);  Authorized by: Tresa Garter MD;  Method used: Electronically to [facility] [address], [place], [place], [place]  [address], Ph: [phone], Fax: [phone]    Prescriptions: NYSTATIN-TRIAMCINOLONE 100000-0.1 UNIT/GM-% CREA (NYSTATIN-TRIAMCINOLONE) Apply to Left and Right inner buttock twice daily as directed  #30 gm x 1   Entered by:   Lanier Prude, CMA(AAMA)   Authorized by:   Tresa Garter MD   Signed by:   Lanier Prude, [facility]) on 07/07/2010   Method used:   Electronically to        [facility] (retail)       [address]       [place], [place]  [address]       Ph: [phone]       Fax: [phone]   RxID:   [id number]

## 2010-07-19 ENCOUNTER — Emergency Department (HOSPITAL_COMMUNITY)
Admission: EM | Admit: 2010-07-19 | Discharge: 2010-07-20 | Disposition: A | Discharge status: Home / Self Care | Payer: Medicare Other | Attending: Emergency Medicine | Admitting: Emergency Medicine

## 2010-07-19 DIAGNOSIS — E119 Type 2 diabetes mellitus without complications: Secondary | ICD-10-CM | POA: Insufficient documentation

## 2010-07-19 DIAGNOSIS — I4891 Unspecified atrial fibrillation: Secondary | ICD-10-CM | POA: Insufficient documentation

## 2010-07-19 DIAGNOSIS — W19XXXA Unspecified fall, initial encounter: Secondary | ICD-10-CM | POA: Insufficient documentation

## 2010-07-19 DIAGNOSIS — M1990 Unspecified osteoarthritis, unspecified site: Secondary | ICD-10-CM | POA: Insufficient documentation

## 2010-07-19 DIAGNOSIS — E785 Hyperlipidemia, unspecified: Secondary | ICD-10-CM | POA: Insufficient documentation

## 2010-07-19 DIAGNOSIS — I10 Essential (primary) hypertension: Secondary | ICD-10-CM | POA: Insufficient documentation

## 2010-07-19 DIAGNOSIS — Y92009 Unspecified place in unspecified non-institutional (private) residence as the place of occurrence of the external cause: Secondary | ICD-10-CM | POA: Insufficient documentation

## 2010-07-19 DIAGNOSIS — M25559 Pain in unspecified hip: Secondary | ICD-10-CM | POA: Insufficient documentation

## 2010-07-19 DIAGNOSIS — S7000XA Contusion of unspecified hip, initial encounter: Secondary | ICD-10-CM | POA: Insufficient documentation

## 2010-07-20 ENCOUNTER — Emergency Department (HOSPITAL_COMMUNITY): Payer: Medicare Other

## 2010-07-21 ENCOUNTER — Telehealth: Payer: Self-pay

## 2010-07-21 NOTE — Telephone Encounter (Signed)
Agree. Thx 

## 2010-07-21 NOTE — Telephone Encounter (Signed)
[facility] Triage Call Report Triage Record Num: [id number] Operator: Merlinda Frederick Patient Name: [name] Call Date & Time: 07/19/2010 10:33:39PM Patient Phone: [phone] PCP: Sonda Primes Patient Gender: Female PCP Fax : [phone] Patient DOB: 10/29/1916 Practice Name: [facility] Reason for Call: Candiss Norse From [facility] [phone] because pt had a fall several weeks, had bruising to her R hip, noticed tonightt that R hip is more swollen and is more painful. Per Falls Guideline, see in 4 hr disposition for severe pain with movement. Will send pt to [facility] for further evaluation. Protocol(s) Used: Falls Recommended Outcome per Protocol: See Provider within 4 hours Reason for Outcome: Severe pain with movement that limits normal activities History of recurring falls and no prior evaluation by provider Care Advice: ~ 07/19/2010 10:46:15PM Page 1 of 1 [email or web address]

## 2010-07-23 ENCOUNTER — Other Ambulatory Visit: Payer: Self-pay | Admitting: *Deleted

## 2010-07-23 MED ORDER — INSULIN PEN NEEDLE 29G X 12MM MISC: 1.0000 | Status: DC

## 2010-07-23 MED ORDER — FUROSEMIDE 20 MG PO TABS: 20.0000 mg | ORAL_TABLET | ORAL | Status: DC

## 2010-07-30 ENCOUNTER — Other Ambulatory Visit: Payer: Self-pay | Admitting: *Deleted

## 2010-07-30 MED ORDER — LORATADINE 10 MG PO TABS: 10.0000 mg | ORAL_TABLET | Freq: Every day | ORAL | Status: DC

## 2010-08-04 ENCOUNTER — Other Ambulatory Visit (INDEPENDENT_AMBULATORY_CARE_PROVIDER_SITE_OTHER): Payer: Medicare Other

## 2010-08-04 ENCOUNTER — Telehealth: Payer: Self-pay | Admitting: Internal Medicine

## 2010-08-04 ENCOUNTER — Encounter: Payer: Self-pay | Admitting: Internal Medicine

## 2010-08-04 ENCOUNTER — Ambulatory Visit (INDEPENDENT_AMBULATORY_CARE_PROVIDER_SITE_OTHER): Payer: Medicare Other | Admitting: Internal Medicine

## 2010-08-04 ENCOUNTER — Other Ambulatory Visit (INDEPENDENT_AMBULATORY_CARE_PROVIDER_SITE_OTHER): Payer: Medicare Other | Admitting: Internal Medicine

## 2010-08-04 DIAGNOSIS — W19XXXA Unspecified fall, initial encounter: Secondary | ICD-10-CM

## 2010-08-04 DIAGNOSIS — L89309 Pressure ulcer of unspecified buttock, unspecified stage: Secondary | ICD-10-CM

## 2010-08-04 DIAGNOSIS — Z0000 Encounter for general adult medical examination without abnormal findings: Secondary | ICD-10-CM

## 2010-08-04 DIAGNOSIS — E119 Type 2 diabetes mellitus without complications: Secondary | ICD-10-CM

## 2010-08-04 DIAGNOSIS — F0280 Dementia in other diseases classified elsewhere without behavioral disturbance: Secondary | ICD-10-CM

## 2010-08-04 DIAGNOSIS — E538 Deficiency of other specified B group vitamins: Secondary | ICD-10-CM

## 2010-08-04 DIAGNOSIS — R279 Unspecified lack of coordination: Secondary | ICD-10-CM

## 2010-08-04 DIAGNOSIS — F068 Other specified mental disorders due to known physiological condition: Secondary | ICD-10-CM

## 2010-08-04 DIAGNOSIS — F0281 Dementia in other diseases classified elsewhere with behavioral disturbance: Secondary | ICD-10-CM

## 2010-08-04 DIAGNOSIS — L8990 Pressure ulcer of unspecified site, unspecified stage: Secondary | ICD-10-CM

## 2010-08-04 DIAGNOSIS — R270 Ataxia, unspecified: Secondary | ICD-10-CM | POA: Insufficient documentation

## 2010-08-04 DIAGNOSIS — Z9181 History of falling: Secondary | ICD-10-CM

## 2010-08-04 DIAGNOSIS — G309 Alzheimer's disease, unspecified: Secondary | ICD-10-CM

## 2010-08-04 DIAGNOSIS — D510 Vitamin B12 deficiency anemia due to intrinsic factor deficiency: Secondary | ICD-10-CM

## 2010-08-04 LAB — URINALYSIS, ROUTINE W REFLEX MICROSCOPIC
Ketones, ur: NEGATIVE
Specific Gravity, Urine: 1.025 (ref 1.000–1.030)
Urobilinogen, UA: 0.2 (ref 0.0–1.0)
pH: 5.5 (ref 5.0–8.0)

## 2010-08-04 LAB — CBC WITH DIFFERENTIAL/PLATELET
Basophils Relative: 0.4 % (ref 0.0–3.0)
Eosinophils Absolute: 0.2 10*3/uL (ref 0.0–0.7)
Eosinophils Relative: 3.1 % (ref 0.0–5.0)
Lymphocytes Relative: 26.6 % (ref 12.0–46.0)
Monocytes Absolute: 0.7 10*3/uL (ref 0.1–1.0)
Neutrophils Relative %: 60.4 % (ref 43.0–77.0)
Platelets: 235 10*3/uL (ref 150.0–400.0)
RBC: 4.18 Mil/uL (ref 3.87–5.11)
WBC: 7.4 10*3/uL (ref 4.5–10.5)

## 2010-08-04 LAB — COMPREHENSIVE METABOLIC PANEL
Albumin: 3.4 g/dL — ABNORMAL LOW (ref 3.5–5.2)
BUN: 25 mg/dL — ABNORMAL HIGH (ref 6–23)
CO2: 31 mEq/L (ref 19–32)
Calcium: 8.4 mg/dL (ref 8.4–10.5)
Chloride: 101 mEq/L (ref 96–112)
Creatinine, Ser: 1.3 mg/dL — ABNORMAL HIGH (ref 0.4–1.2)
GFR: 39.7 mL/min — ABNORMAL LOW (ref >60.00)
Glucose, Bld: 198 mg/dL — ABNORMAL HIGH (ref 70–99)
Potassium: 4.1 mEq/L (ref 3.5–5.1)

## 2010-08-04 LAB — VITAMIN B12: Vitamin B-12: 797 pg/mL (ref 211–911)

## 2010-08-04 MED ORDER — DERMAGRAN BC EX CREA: TOPICAL_CREAM | CUTANEOUS | Status: DC | PRN

## 2010-08-04 NOTE — Assessment & Plan Note (Signed)
Gradual decline.  

## 2010-08-04 NOTE — Assessment & Plan Note (Signed)
Check level 

## 2010-08-04 NOTE — Progress Notes (Signed)
  Subjective:    Patient ID: [name], female    DOB: August 17, 1916, 77 y.o.   MRN: [id number]  HPI   The patient presents for a follow-up of  chronic hypertension, chronic dyslipidemia, type 2 diabetes controlled with medicines  C/o balance issues, she fell once 3 wks ago on her butt: she went to ER and had xray  Review of Systems  Constitutional: Negative for fever and fatigue.  HENT: Negative for rhinorrhea.   Eyes: Negative for redness.  Respiratory: Negative for shortness of breath.   Cardiovascular: Negative for leg swelling.  Gastrointestinal: Negative for nausea and abdominal pain.  Genitourinary: Negative for dysuria.  Musculoskeletal: Negative for back pain.  Skin: Negative for rash.  Neurological: Negative for dizziness and weakness.  Psychiatric/Behavioral: Positive for confusion and decreased concentration. Negative for hallucinations and agitation. The patient is nervous/anxious.        Objective:   Physical Exam  Constitutional: She appears well-developed and well-nourished. No distress.  HENT:  Head: Normocephalic.  Right Ear: External ear normal.  Left Ear: External ear normal.  Nose: Nose normal.  Mouth/Throat: Oropharynx is clear and moist.  Eyes: Conjunctivae are normal. Pupils are equal, round, and reactive to light. Right eye exhibits no discharge. Left eye exhibits no discharge.  Neck: Normal range of motion. Neck supple. No JVD present. No tracheal deviation present. No thyromegaly present.  Cardiovascular: Normal rate, regular rhythm and normal heart sounds.   Pulmonary/Chest: No stridor. No respiratory distress. She has no wheezes.  Abdominal: Soft. Bowel sounds are normal. She exhibits no distension and no mass. There is no tenderness. There is no rebound and no guarding.  Musculoskeletal: She exhibits no edema and no tenderness.  Lymphadenopathy:    She has no cervical adenopathy.  Neurological: She is alert. She displays normal reflexes. No  cranial nerve deficit. She exhibits normal muscle tone. Coordination abnormal.       confused  Skin: No rash noted. No erythema.  Psychiatric: She has a normal mood and affect. Her behavior is normal. Judgment and thought content normal.        Two small shallow 6 mm and 4 mm ulcers on inner R buttock  Assessment & Plan:  DEMENTIA Gradual decline  VITAMIN B12 DEFICIENCY Check level  Decubitus ulcer, buttock Zinc oxide bid    DM-2 Poor control. The best we can do

## 2010-08-04 NOTE — Telephone Encounter (Signed)
[name] , please, inform the patient:  the labs are normal, except for high sugars (not new)   Please, keep  next office visit appointment.   Thank you !

## 2010-08-04 NOTE — Assessment & Plan Note (Signed)
Zinc oxide bid

## 2010-08-05 NOTE — Telephone Encounter (Signed)
Tried calling daughter.. No answer. Will retry later

## 2010-08-06 LAB — PROTEIN ELECTROPHORESIS, SERUM
Albumin ELP: 55.5 % — ABNORMAL LOW (ref 55.8–66.1)
Alpha-1-Globulin: 4.7 % (ref 2.9–4.9)
Alpha-2-Globulin: 11.1 % (ref 7.1–11.8)
Beta 2: 5.6 % (ref 3.2–6.5)
Beta Globulin: 7.2 % (ref 4.7–7.2)
Gamma Globulin: 15.9 % (ref 11.1–18.8)

## 2010-08-07 NOTE — Telephone Encounter (Signed)
P/t's daughter informed

## 2010-08-18 ENCOUNTER — Other Ambulatory Visit: Payer: Self-pay | Admitting: Internal Medicine

## 2010-09-02 NOTE — Assessment & Plan Note (Signed)
[facility]                            CARDIOLOGY OFFICE NOTE   [name], [name]                       MRN:          [id number]  DATE:09/09/2006                            DOB:          [birth year]    PRIMARY:  Georgina Quint. Plotnikov, M.D.   REASON FOR PRESENTATION:  Evaluate patient with flutter status post  ablation.   HISTORY OF PRESENT ILLNESS:  The patient is 77 years old.  She comes  back for followup after atrial flutter ablation.  She has done quite  well.  She was asymptomatic at the time of this diagnosis.  It was found  incidentally.  She did have a slight non-Q-wave MI but no obstructive  coronary disease.  She underwent successful ablation.  She has since  been taken off of Coumadin by Dr. [name].  She describes no  palpitations, presyncope, or syncope.  She has no new shortness of  breath.  She does exercises.  She walks to the dining hall.  She does  have some mild lower extremity swelling which is chronic and unchanged.   PAST MEDICAL HISTORY:  1. Atrial flutter with rapid ventricular response status post      ablation.  2. Type 2 myocardial infarction (supply/demand).  3. Nonobstructive coronary disease.  4. Well-preserved ejection fraction.  5. Mild mitral regurgitation.  6. Diabetes mellitus.  7. Dyslipidemia.  8. Chronic renal insufficiency.  9. Diabetic retinopathy.  10.Osteoporosis.  11.Gastroesophageal reflux disease.  12.Dementia.   ALLERGIES:  None.   MEDICATIONS:  1. TriCor 145 mg daily.  2. Pravachol 40 mg daily.  3. Exelon 6 mg b.i.d.  4. Glimepiride 2 mg q.a.m.  5. Calcium.  6. Nexium 40 mg daily.  7. Lasix 20 mg daily.  8. Potassium 20 mEq daily.  9. Multivitamin.  10.Benefiber.  11.Loratadine.   REVIEW OF SYSTEMS:  As stated in the HPI and otherwise negative for  other systems.   PHYSICAL EXAMINATION:  GENERAL:  The patient is in no distress.  VITAL SIGNS:  Blood pressure 110/70, heart rate 85  and regular, weight  158 pounds.  NECK:  No jugular venous distention at 45 degrees.  Carotid upstroke  brisk and symmetric.  No bruits, no thyromegaly.  LYMPHATICS:  No adenopathy.  LUNGS:  Clear to auscultation bilaterally.  HEART:  PMI not displaced or sustained.  S1 and S2 within normal limits.  No S3, no S4.  No clicks, rubs, or murmurs.  ABDOMEN:  Mildly obese, positive bowel sounds normal in frequency and  pitch.  No hepatomegaly, no rebound, no guarding.  SKIN:  No rashes, no nodules.  EXTREMITIES:  Show 2+ pulses, mild bilateral lower extremity edema to  above the ankles.  NEUROLOGIC:  Grossly intact.   EKG:  Sinus rhythm, rate 85, right bundle-branch block, no acute ST-T  wave changes.   ASSESSMENT AND PLAN:  1. Atrial flutter.  This is now treated and there is no recurrence.      No further cardiovascular testing is suggested.  2. Lower extremity edema.  The patient treats this by keeping her  feet      elevated.  No further therapy is warranted.  3. Followup.  Will see her back as needed.     Rollene Rotunda, MD, [facility]  Electronically Signed    JH/MedQ  DD: 09/09/2006  DT: 09/09/2006  Job #: [id number]   cc:   Georgina Quint. Plotnikov, MD

## 2010-09-02 NOTE — Assessment & Plan Note (Signed)
[facility]                                 ON-CALL NOTE   NAME:[name], Meador                       MRN:          [id number]  DATE:09/18/2006                            DOB:          24-Apr-1916    TIME RECEIVED:  10:33 a.m.   CALLER:  Dondra Spry with [facility].   PHONE NUMBER:  [phone].   Patient currently takes glimepiride 2 mg twice daily and has been  feeling fine; however, over the past several days, her blood glucoses  have been elevated a bit.  She had a reading yesterday evening of 210.  Her reading this morning was 208 before breakfast, and it was 281 after  breakfast.  She is drinking plenty of water.   My response is to restrict her carbohydrate intake over the weekend.  They should increase her glimepiride to 2 mg tablets to take 2 at a time  b.i.d., and they should contact Dr. Posey Rea on Monday.     Tera Mater. Clent Ridges, MD  Electronically Signed    SAF/MedQ  DD: 09/18/2006  DT: 09/18/2006  Job #: [id number]

## 2010-09-05 NOTE — Assessment & Plan Note (Signed)
[facility]                            CARDIOLOGY OFFICE NOTE   [name], [name]                       MRN:          [id number]  DATE:05/19/2006                            DOB:          25-Jul-1916    Cardiologist is Rollene Rotunda, MD.  Electrophysiologist is Doylene Canning.  [name], MD.  Primary care physician is Georgina Quint. Plotnikov, MD.   HISTORY OF PRESENT ILLNESS:  [name] is an 77 year old female patient  who presents to the office today for post-hospitalization follow-up.  She was recently admitted with atrial flutter with rapid ventricular  response.  She had a mild elevation in her troponin and was set up for  cardiac catheterization.  This revealed mild nonobstructive disease.  This was treated medically.  She then went for radiofrequency catheter  ablation of her typical atrial flutter.  This was done by Dr. [name] on  January 18.  This was successful and she was placed on Coumadin.  She  has been followed by the clinic and her INR today was 2.0.  She denies  any chest pain, shortness of breath, tachypalpitations, syncope or near-  syncope.  She has had no difficulties with her right femoral artery  site.   CURRENT MEDICATIONS:  1. Tricor 145 mg a day.  2. Pravachol 40 mg a day.  3. Aspirin 325 mg daily.  4. Exelon 6 mg b.i.d.  5. Glimepiride 2 mg daily.  6. Calcium 600 mg daily.  7. Nexium 40 mg daily.  8. Lasix 20 mg daily.  9. Potassium 20 mEq daily.  10.Multivitamin daily.  11.Colace b.i.d.  12.Benefiber daily.  13.Coumadin as directed.   ALLERGIES:  PENICILLIN.   PHYSICAL EXAMINATION:  GENERAL:  She is an elderly female in no acute  distress.  VITAL SIGNS:  Blood pressure 122/72, pulse 80, weight 152 pounds.  HEENT:  Unremarkable.  NECK:  Without JVD.  CARDIAC:  S1-S2, regular rate and rhythm without murmurs, clicks, rubs  or gallops.  LUNGS:  Clear to auscultation bilaterally.  No wheeze, rhonchi or rales.  ABDOMEN:   Soft, nontender, with normoactive bowel sounds, no  organomegaly.  EXTREMITIES:  Without edema.  Calves are soft, nontender.  Right groin  without hematoma or bruit.   Electrocardiogram reveals sinus rhythm with a heart rate of 80, first  degree AV block with a PR interval of 232 msec, right bundle branch  block.   IMPRESSION:  1. Atrial flutter with rapid ventricular response.      a.     Status post radiofrequency catheter ablation resulting in       normal sinus rhythm.      b.     Coumadin therapy.  2. Non-ST elevation myocardial infarction in the setting of #1.      a.     Peak troponin 0.35; CK-MBs negative.      b.     Minimal coronary plaque by catheterization:  Left main       calcified with 20% mid stenosis; left anterior descending artery       20% mid stenosis;  minor luminal irregularities in the ramus       intermedius; circumflex with scattered 20% stenoses; and right       coronary artery with minor luminal irregularities.  3. Good left ventricular function with an ejection fraction of 65%.  4. Mild mitral regurgitation by echocardiogram May 06, 2006.  5. Diabetes mellitus.  6. Dyslipidemia.  7. Chronic renal insufficiency.  8. Diabetic retinopathy.  9. Osteoarthritis.  10.Gastroesophageal reflux disease.  11.Dementia.   PLAN:  The patient presents to the office today for post-hospitalization  follow-up.  She is doing well from a cardiovascular standpoint.  She has  had no palpitations, chest pain or shortness of breath.  She remains in  normal sinus rhythm.  She remains on Coumadin therapy and has follow-up  with Dr. [name] later next month.  She had a mild troponin elevation in  the setting of rapid atrial flutter.  I suspect this was probably  secondary to supply-demand mismatch as she had only minimal amount of  non-obstructive coronary disease on her catheterization.  She will  continue with the above-listed medications and follow up as planned with   Dr. [name].  I will get her back in follow-up with Dr. Antoine Poche in the  next several months.      Tereso Newcomer, PA-C  Electronically Signed      Bevelyn Buckles. Bensimhon, MD  Electronically Signed   SW/MedQ  DD: 05/19/2006  DT: 05/19/2006  Job #: [id number]   cc:   Georgina Quint. Plotnikov, MD

## 2010-09-05 NOTE — Op Note (Signed)
NAMECHERYLYN, SUNDBY NO.:  [id number]   MEDICAL RECORD NO.:  [id number]          PATIENT TYPE:  INP   LOCATION:  3707                         FACILITY:  [facility]   PHYSICIAN:  Duke Salvia, MD, FACCDATE OF BIRTH:  1917/03/15   DATE OF PROCEDURE:  05/05/2006  DATE OF DISCHARGE:                               OPERATIVE REPORT   Thank you very much for asking [place] to see [name] in consultation  for atrial flutter.   [name] is an 77 year old woman who presented to Dr. Posey Rea today  not feeling quite right and was found to be in atrial flutter with rapid  ventricular response.  She was unaware of it.  She stays at [facility] and there was no record of a rapid pulse noted even as  of yesterday.   Her thromboembolic risk factors notable for diabetes and age.   She denies diabetes, prior stroke or heart failure.   Her laboratories on admission are notable for troponin initially 0.25,  and her BNP is also elevated 508.   PAST MEDICAL HISTORY:  In addition to the above was notable for  1. Remote history of peptic ulcer disease and bleeding in the early      90s.  2. Hemorrhoids.  3. Anxiety/depression.  4. Question Alzheimer's.   Her past cardiac evaluation included a stress Cardiolite in 2004 that  was normal with an ejection fraction of greater than 70%.   MEDICATIONS ON ADMISSION:  Include Pravastatin 40. Exelon 6 b.i.d.,  glimepiride 2, Tricor 145, aspirin 325, Nexium and Lasix.   She is allergic to PENICILLIN AND DOXYCYCLINE.   SOCIAL HISTORY:  She is at [facility].  She has nine children.  [place]  [place], is a housewife.  She does not use cigarettes, alcohol or  recreational drugs. Her review of systems is as noted on the intake  sheet from [place] __________ and is not further recounted here.   PHYSICAL EXAMINATION:  GENERAL:  She is an elderly Caucasian female  appearing her stated age of 77, blood pressure is 126/82.  Her  pulse was  105 and irregular.  HEENT:  No icterus, no xanthoma.  NECK:  Veins were flat.  Carotids were brisk and full bilaterally  without bruits.  BACK:  Without kyphosis, scoliosis.  LUNGS:  Clear.  HEART:  Sounds were irregular with an early systolic murmur.  ABDOMEN:  Soft with active bowel sounds.  Femoral pulses were 2+, distal pulses were intact.  She was wearing TED  hose. There was no cyanosis or edema.  NEUROLOGIC:  Exam was grossly normal.   Electrocardiogram demonstrates typical atrial flutter with 2:1  conduction primarily a rate of 120 with of the atrial rate of 240.   IMPRESSION:  1. Atrial flutter - typical with a rapid ventricular response.  2. Thromboembolic risk factors notable for diabetes and age.  3. Positive troponins.  4. Gastroesophageal reflux disease and peptic ulcer disease in the      early 90s.  5. Recent cold/URI with cough.  6. Positive BNP.Marland Kitchen  [name] has atrial flutter with a rapid ventricular response.  She  likely had some degree of diastolic dysfunction and elevated BNP maybe  partly responsible for cold as a manifestation of heart failure.  Her  other thromboembolic risk factors would give her estimated, stroke rate  of around 5-7% I would estimate.  Coumadin certainly would reduce that  risk typically by 80%, although she is at some significant risk for  bleeding given her age which I also estimate in the near to 1% range.  Given those things, we talked about alternative therapies for atrial  flutter including EP study with RF catheter ablation.  I estimate the  risk of that are three per thousand which is a little bit higher than  normal.  There is a risk of heart block and they understand these risks.  The overall risk benefit though I think with no antecedent history of  atrial fibrillation would support atrial flutter ablation as a primary  therapy.   Given her positive troponins, she is to undergo catheterization.   Thereafter,  she will need coumadinization and either flutter ablation  preceded by TEE or preceded by three weeks of therapeutic INR.  We will  wait and see what happens over the next 24 hours with a catheterization  before making that final decision.   RECOMMENDATIONS:  Based on the above, we will therefore  1. Continue heparin and begin Coumadin.  2. Catheterization with Dr. Antoine Poche.  3. Augmented rate control.  4. Catheter ablation of their atrial flutter with or without TEE      depending on the above.   Thank you for the consultation.      Duke Salvia, MD, [facility]  Electronically Signed     SCK/MEDQ  D:  05/05/2006  T:  05/06/2006  Job:  [id number]   cc:   Georgina Quint. Plotnikov, MD  Electrophys lab

## 2010-09-05 NOTE — H&P (Signed)
[name], [name]                ACCOUNT NO.:  [id number]   MEDICAL RECORD NO.:  [id number]          PATIENT TYPE:  INP   LOCATION:  1832                         FACILITY:  [facility]   PHYSICIAN:  Georgina Quint. Plotnikov, MDDATE OF BIRTH:  02-12-1917   DATE OF ADMISSION:  05/05/2006  DATE OF DISCHARGE:                              HISTORY & PHYSICAL   CHIEF COMPLAINT:  None.   HISTORY OF PRESENT ILLNESS:  The patient is an 77 year old female who  presents with her daughter for a routine 79-month followup visit.  She  was observed to have a heart rate in 140-150 range.  Upon active  questioning, admitted to being lightheaded this morning.  Denies chest  pain or shortness of breath, no syncopal episodes.   PAST MEDICAL HISTORY:  1. Diabetes.  2. Hypertension.  3. Chronic renal failure.  4. Diabetic retinopathy.  5. Dyslipidemia.  6. Osteoarthritis.  7. Constipation.  8. Peptic ulcer disease/GERD.  9. Dementia.   ALLERGIES:  PENICILLIN.   CURRENT MEDICATIONS:  1. Pravachol 40 mg daily.  2. Glimepiride 2 mg daily.  3. Exelon 6 mg b.i.d.  4. Tricor 145 mg daily.  5. Aspirin 325 mg daily.  6. Nexium 40 mg daily.  7. Furosemide 20 mg daily.  8. Eye drops.   SOCIAL HISTORY:  She lives at [employer] [facility], does not smoke or drink.   FAMILY HISTORY:  Both parents deceased, positive for diabetes and  coronary artery disease.   REVIEW OF SYSTEMS:  Cold-like symptoms lately with sore throat,  lightheadedness this morning.  Denies chest pain.  More problems with  elevated sugars over the past several days, attributed to high-  carbohydrate snacks at night.  The rest of the 18-point system review is  as above or negative.   PHYSICAL:  VITAL SIGNS:  Blood pressure 120s over 73, heart rate 146,  temperature 97.8, weight 165 pounds.  GENERAL:  She is in no acute distress.  HEENT:  With erythematous throat.  NECK:  Supple.  No thyromegaly or bruit.  LUNGS:  Clear to percussion, no wheezes or  rales.  HEART:  S1 and S2, regular, tachycardiac, no gallop.  ABDOMEN:  Soft, nontender.  No organomegaly or masses.  LOWER EXTREMITIES:  Without edema.  Calves nontender.  PSYCH:  She is alert, cooperative, denies being depressed.  SKIN:  Clear with aging changes.   LABS:  EKG with atrial tachycardia, right bundle branch block.   ASSESSMENT AND PLAN:  1. Supraventricular tachycardia, new.  Will admit to telemetry, start      on Cardizem bolus and drip, obtain cardiology consult ;rule out      myocardial infarction, obtain an echocardiogram.  2. Diabetes, poorly controlled lately.  Will increase glimepiride to 2      mg twice a day, sliding scale Humalog as needed.  3. Dyslipidemia, on therapy.  4. Chronic renal failure stage 3, under surveillance.  5. Upper respiratory tract infection.  Will start on a Z-Pak.  Delsym      p.r.n.  6. Lightheadedness due to problem #1.  Georgina Quint. Plotnikov, MD  Electronically Signed     AVP/MEDQ  D:  05/05/2006  T:  05/06/2006  Job:  [id number]

## 2010-09-05 NOTE — Discharge Summary (Signed)
[name], [name] NO.:  [id number]   MEDICAL RECORD NO.:  [id number]          PATIENT TYPE:  INP   LOCATION:  3707                         FACILITY:  [facility]   PHYSICIAN:  Titus Dubin. Hopper, MD,FACP,FCCPDATE OF BIRTH:  May 15, 1918   DATE OF ADMISSION:  05/05/2006  DATE OF DISCHARGE:                               DISCHARGE SUMMARY   ADMITTING DIAGNOSES:  1. Supraventricular tachycardia, new.  2. Diabetes, poorly controlled.  3. Dyslipidemia.  4. Chronic renal failure stage 3.  5. Upper respiratory infection.  6. Lightheadedness secondary to #1.   DISCHARGE DIAGNOSES:  1. Atrial flutter with rapid ventricular response.  2. Non-Q-wave myocardial infarction.   BRIEF HISTORY:  [name] is an 77 year old female who was seen by Dr.  Posey Rea for a 45-month routine followup.  She was found to have a heart  rate in the 140-150 range.  She did admit to lightheadedness upon  further questioning.  She denied any chest pain or shortness of breath.  She was admitted to telemetry and Cardizem bolus and drip initiated and  cardiology consulted.   Initial chest x-ray showed no active cardiopulmonary disease.  Followup  film again revealed no active process on May 06, 2005.   Admitting labs revealed a hematocrit of 35.5, which remained basically  stable; hematocrit was recorded as low as 32.5.  Despite the history of  respiratory tract infection, her white count was normal.   Glucoses ranged from 61-146 and she was covered with sliding scale  insulin.  Her hemoglobin A1c was 7.3 indicating poor diabetic control.  This would be compatible with an average blood sugar of 180 and a 46%  increased cardiovascular risk.   Her creatinine ranged from 1.0 to 1.16, but glomerular filtration rate  was 44-52 indicating significant renal compromise.  CPK was 132 with a  relative index of 2.2.  BNP (brain natriuretic peptide) was 508 with  normals less than 100.   Lipid  profile was at goal with the exception of an HDL of 40.  Specifically, her total cholesterol was 126, triglycerides were 90, and  LDL was 68.  TSH was therapeutic at 2.57.   The patient underwent catheterization, which revealed no obstructive  coronary disease.  She spontaneously converted to normal sinus rhythm.  Electrophysiologic study and RF catheter ablation of atrial flutter was  completed May 07, 2006.   The patient was evaluated by cardiology on May 08, 2006.  It was  recommended that she continue Coumadin with monitoring in the Coumadin  clinic.  She was felt stable for discharge from a cardiac standpoint.  The Coumadin was to be continued for 4-6 weeks and then discontinued.  Aspirin was to be discontinued.  She would see Dr. [name] in follow up  in 4 weeks from discharge.   On May 08, 2006 she was complaining of leg and back pain.  This was  attributed to her being maintained in a supine position for 6 hours  following the interventional study.  She did have negative straight leg  raising.  She was held in the hospital another  24 hours, as she had not  ambulated for over 72 hours.  Medications were held pending response of  the low back pain and leg pain to mobilization.  This was accomplished  for 24 hours prior to discharge without evidence of instability or  postural hypotension.   The day of discharge, she had no complaints and had been to the bathroom  with help.  Plans were made for her to return to [facility].   The day of discharge, her temperature was 97.1, pulse was 74 and  regular, respiratory rate was 18, CBG was 106, blood pressure was  117/64, and O2 sat was 96% on room air.  She had no increased work of  breathing.  Her rhythm was regular.  Abdomen was nontender.   Her hematocrit was stable at 33.6.  INR was 1.5.  She was on Coumadin 5  mg daily.   Although her hemoglobin A1c indicated uncontrolled diabetes, insulin was  not  initiated at the nursing home, as she had previously experienced  profound hypoglycemia following 70/25 insulin, according to her  daughter.   Her glimepiride was increased to 2 mg twice a day.   The only other new medication was the Coumadin 5 mg, which was to be  taken after 5:00 each day beginning May 10, 2006.  She was given a  dose prior to discharge.  The PICC line was discontinued.   The aspirin will be discontinued as per cardiology'Cindy recommendations.   Her discharge status is improved.  Prognosis is guarded due to her  advanced age and multi-systems disease.   Her diet will be low sodium, heart healthy, ADA 2000 calorie.      Titus Dubin. [name], MD,FACP,FCCP  Electronically Signed     WFH/MEDQ  D:  05/09/2006  T:  05/09/2006  Job:  [id number]   cc:   Doylene Canning. [name], MD  Georgina Quint Plotnikov, MD

## 2010-09-05 NOTE — Assessment & Plan Note (Signed)
[facility]                                 ON-CALL NOTE   CICLALY, MULCAHEY                         MRN:          [id number]  DATE:03/17/2007                            DOB:          08-04-1916    This is a dictation.  Patient's name, [name], March 17, 2007,  at 4:47 p.m., date of birth is 15-Mar-1917, phone number is [phone].  Regular doctor is Dr. Posey Rea from Dr. Milinda Antis on call.  Caller  is Dondra Spry from [facility].  Question is about diabetes.  The  patient, [name], has been off of Januvia since October 30th  because it was denied by her insurance.  Since November 1st, her sugars  have been running high from 188 to 200.  After her Thanksgiving meal, it  is running today at 368.  She has had a little cold with a cough, and no  fever, no shortness of breath, and she feels fine.  She is currently on  Glimepiride 4 mg b.i.d., and Dondra Spry says they just got word that her  Alma Friendly was approved by her insurance company so she can go back on it.  I told her to go ahead and start her back on her regular Januvia dose,  which is 15 mg daily in the morning and if she is not improved with  blood sugar readings to call back and to call the office at [facility] to  update them when he opens tomorrow.     Marne A. Tower, MD  Electronically Signed    MAT/MedQ  DD: 03/17/2007  DT: 03/17/2007  Job #: [id number]   cc:   Georgina Quint. Plotnikov, MD

## 2010-09-05 NOTE — Consult Note (Signed)
[name], [name] NO.:  [id number]   MEDICAL RECORD NO.:  [id number]          PATIENT TYPE:  INP   LOCATION:  3707                         FACILITY:  [facility]   PHYSICIAN:  Rollene Rotunda, MD, FACCDATE OF BIRTH:  03-26-1917   DATE OF CONSULTATION:  05/05/2006  DATE OF DISCHARGE:                                 CONSULTATION   PRIMARY CARE PHYSICIAN:  Georgina Quint. Plotnikov, M.D.   REASON FOR CONSULTATION:  Evaluate patient with tachycardia.   HISTORY OF PRESENT ILLNESS:  The patient is a very lovely 77 year old  white female.  She has excellent health overall.  She did have a stress  test in 2004 because of risk factors.  She had an EF of 70% with no  evidence of ischemia or infarction.   She was in her usual state of health.  This morning, she was noted at  [facility] where she resides to have a rapid heart rate.  I did find out  yesterday it was in the 60s.  There has not been a documented  tachyarrhythmia prior to this.  The patient says she felt a little bit  lightheaded yesterday, but otherwise did not notice anything.  She  certainly did not feel any tachypalpitations.  She had no presyncope or  syncope.  She has had no chest pain or shortness of breath.  She denies  any PND or orthopnea.  She incidentally had an appointment with Dr.  Posey Rea today and was noted to have a right bundle branch block  tachycardia with a rate of 140.  It was a regular tachycardia.   The patient walks quite a bit at [facility].  She denies any chest  discomfort, neck discomfort, arm discomfort, activity-induced nausea,  vomiting or excessive diaphoresis.  She has not had any fevers, chills,  change in her weight, hair or voice recently.   Of note, she is noted to have a BNP level that is slightly elevated at  508.  She also has a troponin of 0.25 in the emergency room.   PAST MEDICAL HISTORY:  1. Diabetes mellitus times years.  2. Hyperlipidemia times years.  3. There is no  history of hypertension.  4. Positive history of hemorrhoids.  5. Gastroesophageal reflux disease.  6. Peptic ulcer disease.  7. Question of mild dementia.   PAST SURGICAL HISTORY:  1. Appendectomy.  2. Colonic polyps resected.   ALLERGIES:  1. PENICILLIN.  2. DOXYCYCLINE.   MEDICATIONS:  1. Pravastatin 40 mg daily.  2. Exelon 6 mg b.i.d.  3. Multivitamin.  4. Glimepiride 2 mg daily.  5. Tricor 145 mg daily.  6. Aspirin 325 mg daily.  7. Nexium 40 mg daily.  8. Lasix 20 liters daily.  9. Potassium.   SOCIAL HISTORY:  The patient lives at [facility].  She is a widow.  She  never smoked cigarettes.  She does not drink alcohol.  She had 9  children.   FAMILY HISTORY:  Negative for early onset coronary artery disease.   REVIEW OF SYSTEMS:  As stated in the HPI.  Positive for mild sinus  drainage, occasional mild rectal bleeding evaluated in the past by Dr.  Posey Rea.   PHYSICAL EXAMINATION:  GENERAL:  The patient is pleasant, in no  distress.  VITAL SIGNS:  Blood pressure 126/82, heart rate 100 and irregular,  afebrile.  HEENT:  Eyes unremarkable.  Pupils equal, round and reactive to light.  Fundi not visualized.  Oral mucosa unremarkable.  NECK:  No jugular distension at 45 degrees.  Carotid upstroke brisk and  symmetric.  No bruits, no thyromegaly.  LYMPHATICS:  No cervical, axillary or inguinal adenopathy.  LUNGS:  Clear to auscultation bilaterally.  BACK:  No costovertebral angle tenderness.  CHEST:  Unremarkable.  HEART:  PMI not displaced or sustained.  S1-S2 within normal limits.  No  S3, no murmurs.  ABDOMEN:  Flat, positive bowel sounds.  Normal frequency and pitch.  No  bruits, no rebound, no guarding,  No midline pulse.  No  mass,  hepatomegaly, splenomegaly.  SKIN:  No rashes, no nodules.  EXTREMITIES:  2+ pulses upper, 2+ femorals without bruits, 2+ popliteals  bilaterally, 1+ dorsalis pedis and posterior tibialis bilaterally.  No  cyanosis, no clubbing,  no edema.  NEUROLOGIC:  Oriented to person, place and time.  Cranial nerves II-XII  grossly intact.  Motor grossly intact throughout.   ELECTROCARDIOGRAM:  Atrial flutter with variable conduction, right  bundle branch block, no acute ST-T wave changes.   ASSESSMENT/PLAN:  1. Atrial flutter.  The patient has atrial flutter.  I have not seen      fibrillation or another arrhythmia.  I think this is potentially      ablatable, and I will consult Dr. Graciela Husbands for this.  I do not know      the onset of this, though I suspect it was less than 24 hours.  We      probably will need a TEE, as well as a few weeks of Coumadin at      least.  I will start her on heparin for now.  She does have some      mild spotty rectal bleeding felt to be some mild hemorrhoids, but      has never had other contraindication.  She will be rate controlled      with Cardizem.  I will also get an echocardiogram, and hopefully      her rate will be a little slower when we do this.  2. Non-Q-wave myocardial infarction.  I suspect that this is a type 2      myocardial infarction.  However, she has significant risk factors      and diabetes.  Therefore, she needs a cardiac catheterization to      exclude high-grade      obstructive coronary disease before other procedures.  The risks      and benefits including stroke, death, heart attack, bleeding,      bruising, vascular trauma, emboli, renal insufficiency and allergic      reaction have been described.  The patient understands and agrees      to proceed.      Rollene Rotunda, MD, [facility]  Electronically Signed     JH/MEDQ  D:  05/05/2006  T:  05/06/2006  Job:  [id number]   cc:   Georgina Quint. Plotnikov, MD

## 2010-09-05 NOTE — Assessment & Plan Note (Signed)
[facility]                         ELECTROPHYSIOLOGY OFFICE NOTE   NAME:YATESGabriel, Paulding                       MRN:          [id number]  DATE:06/28/2006                            DOB:          10/08/1916    Ms. Golda returns today for followup.  She is a very pleasant elderly  woman who presented to the hospital with atrial flutter and a rapid  ventricular response, who underwent electrophysiologic study and  catheter ablation back in January.  She returns today for followup.  She  is doing well.  She has had no recurrent palpitations and has had no  chest pain or syncope.   PHYSICAL EXAMINATION:  GENERAL:  She is a pleasant, elderly-appearing  woman in no acute distress.  VITAL SIGNS:  Blood pressure is 130/77.  Pulse was 86 and regular.  The  respirations were 18.  The weight was 152 pounds.  NECK:  No jugular venous distention.  LUNGS:  Clear bilaterally to auscultation.  CARDIOVASCULAR:  Regular rate and rhythm.  Normal S1 and S2.  EXTREMITIES:  No edema.   The EKG demonstrates sinus rhythm with right bundle branch block.   IMPRESSION:  1. Symptomatic atrial flutter.  2. Status post pacemaker insertion.   DISCUSSION:  Overall, Ms. Antu is stable.  She will continue on her  present medical therapy, except that I have asked that she stop her  Coumadin, as her thromboembolic risk appears to be back to baseline now  that she is back in sinus rhythm after her flutter ablation.  There has  been no evidence of A fib.     Doylene Canning. [name], MD  Electronically Signed    GWT/MedQ  DD: 06/28/2006  DT: 06/28/2006  Job #: [id number]   cc:   Georgina Quint. Plotnikov, MD

## 2010-09-05 NOTE — Assessment & Plan Note (Signed)
[facility]                                 ON-CALL NOTE   NAME:[name], [name]                         MRN:          [id number]  DATE:05/09/2006                            DOB:          11/12/1916    TIME OF CALL:  2:50 p.m.   PHONE NUMBER:  [phone]   REGULAR DOCTOR:  Dr. Posey Rea.   CALLER:  Gery Pray from [facility] in [facility].  He is  calling to verify the patient's medications.  She just has been  discharged from the hospital.  He said he needs to verbally run through  the medicine list with a doctor before they can start the medicines.  They are as follows.  1. Glimepiride 1 q. a.m.  2. TriCor 145 mg daily.  3. Aspirin 325 mg daily.  4. Calcium 600 mg daily.  5. Nexium 40 mg daily.  6. Lasix 20 mg daily.  7. Potassium 20 mEq daily.  8. Exelon 6 mg b.i.d.  9. Multivitamins daily.  10.Eye lubricant drops p.r.n.  11.Coumadin 5 mg daily.  They will be checking a Pro Time tonight with      dose to be adjusted as needed.  12.Pravastatin 40 mg daily.  13.Colace b.i.d.  14.Benefiber daily mixed with water.   She was in the hospital with atrial flutter.  She has a history of  cardiomegaly, hypertension, and diabetes type 2.  Also GERD.  She said  her sugar has been in good control.  They will check her sugar tonight  and then resume an every morning sugar check, and will call me if they  have any questions or concerns following, and she will be transferred to  the assisted living soon, and follow up with Dr. Posey Rea p.r.n.  I  confirmed these with him.     Marne A. Tower, MD  Electronically Signed    MAT/MedQ  DD: 05/09/2006  DT: 05/09/2006  Job #: [id number]   cc:   Georgina Quint. Plotnikov, MD

## 2010-09-05 NOTE — Cardiovascular Report (Signed)
[name], [name] NO.:  [id number]   MEDICAL RECORD NO.:  [id number]          PATIENT TYPE:  INP   LOCATION:  3707                         FACILITY:  [facility]   PHYSICIAN:  Jonelle Sidle, MD DATE OF BIRTH:  May 08, 1916   DATE OF PROCEDURE:  DATE OF DISCHARGE:                            CARDIAC CATHETERIZATION   REFERRING PHYSICIAN:  Rollene Rotunda, M.D.   PRIMARY CARE PHYSICIAN:  Georgina Quint. Plotnikov, M.D.   INDICATIONS:  Ms. Stoklosa is a pleasant 77 year old woman with a history  of hyperlipidemia and type 2 diabetes mellitus who presented with atrial  flutter associated with rapid ventricular response and a mildly  increased troponin-I level.  She is referred now for diagnostic cardiac  catheterization to assess the coronary anatomy for any potential  revascularization options.  The risks and benefits were explained to her  in advance and informed consent was obtained.   PROCEDURE PERFORMED:  1. Left heart catheterization.  2. Selective coronary angiography.  3. Left Ventriculography.   ACCESS AND EQUIPMENT:  The area about the right femoral artery was  anesthetized with 1% lidocaine and a 6-French sheath was placed in the  right femoral artery via the modified Seldinger technique.  Standard  preformed 6-French JL-4 and JR-4 catheters were used for selective  coronary angiography and an angled pigtail catheter was used for left  heart catheterization and left ventriculography.  A total of 90 cc of  Omnipaque were used.  All exchanges were made over wire and the patient  tolerated procedure well without immediate complications.   HEMODYNAMIC RESULTS:  Aorta 127/53 mmHg.  Left ventricle 126/24 mmHg.   ANGIOGRAPHIC FINDINGS:  1. The left main coronary artery is moderately calcified, although      with no flow-limiting stenoses.  There is approximately 20% mid to      distal stenosis.  This vessel gives rise to the left anterior      descending, a  ramus intermedius and circumflex coronary arteries.  2. The left anterior descending is a medium caliber vessel with septal      perforators and small distal and apical diagonal branches.  Minor      luminal irregularities are noted including approximately 20% mid      vessel stenosis.  3. There is a medium caliber ramus intermedius that branches.  Minor      luminal irregularities are noted.  4. The circumflex coronary artery is a medium to large caliber      dominant vessel providing the posterior descending branch.      Otherwise there are three obtuse marginal branches.  Scattered 20%      stenoses are noted.  5. The right coronary artery is a small nondominant vessel with two      right ventricular marginal branches.  Minor luminal irregularities      are noted.   Left ventriculography was performed in the RAO projection and reveals an  ejection fraction of approximately 65% without focal wall motion  abnormality and with only trivial mitral regurgitation associated with  ventricular ectopy.   DIAGNOSES:  1.  Mild coronary atherosclerosis.  There is calcification of the left      main coronary artery but nonobstructive disease throughout.  The      coronary system is left dominant.  2. Left ventricular ejection fraction of approximately 65% with no      focal wall motion abnormalities and a left ventricular end-      diastolic pressure of 24 mmHg.   DISCUSSION:  I reviewed  the results with the patient and her available  family.  Would anticipate medical therapy for her coronary  atherosclerosis.  Will otherwise plan to resume heparin 6 hours after  sheath pull, given the patient's paroxysmal atrial arrhythmias, and  plans can continue on for electrophysiology evaluation and ablation per  Dr. Graciela Husbands.  Coumadin will also be started.      Jonelle Sidle, MD  Electronically Signed     SGM/MEDQ  D:  05/06/2006  T:  05/06/2006  Job:  [id number]   cc:   Rollene Rotunda, MD,  [facility]

## 2010-09-05 NOTE — Op Note (Signed)
[name], [name] NO.:  [id number]   MEDICAL RECORD NO.:  [id number]          PATIENT TYPE:  INP   LOCATION:  3707                         FACILITY:  [facility]   PHYSICIAN:  Doylene Canning. [name], MD    DATE OF BIRTH:  04/09/1917   DATE OF PROCEDURE:  05/07/2006  DATE OF DISCHARGE:                               OPERATIVE REPORT   PROCEDURE PERFORMED:  Electrophysiologic study and RF catheter ablation  of atrial flutter.   INTRODUCTION:  The patient is a very pleasant 77 year old woman who was  admitted to the hospital with atrial flutter and rapid ventricular  response, and ruled in for a non-Q-wave MI.  The patient subsequently  spontaneously returned to sinus rhythm.  Her catheterization  demonstrated no obstructive coronary disease.  She is now referred for  catheter ablation.   PROCEDURE:  After informed consent was obtained, the patient was taken  the diagnostic EP lab in the fasting state.  After the usual preparation  and draping, intravenous fentanyl and midazolam was given for sedation.  A 6-French hexapolar catheter was inserted percutaneously in the right  jugular vein and advanced to the coronary sinus.  A 7-French 20-pole  halo catheter was inserted percutaneously in the right femoral vein and  advanced to the right atrium.  A 5-French quadripolar catheter was  inserted percutaneously in the right femoral vein and advanced to the  His bundle region.  After measurement of the basic intervals, rapid  atrial pacing was carried out from the coronary sinus at a pacing cycle  length of 600 milliseconds; and stepwise decreased down to 410  milliseconds, where AV Wenckebach was observed.  During rapid atrial  pacing the P-R interval was less than the R-R interval, and there was no  inducible SVT.  Additional decrements were then carried out, starting at  290 milliseconds down to 210 milliseconds, resulting in the initiation  of atrial flutter.  This was typical  atrial flutter with a  counterclockwise tricuspid annular activation around the right atrium,  and early activation in the proximal coronary sinus.  The 7-French  quadripolar ablation catheter was then manipulated into the right atrium  and a single RF energy application was delivered between the tricuspid  valve annulus and the eustachian ridge.  This resulted in termination of  flutter.  Additional RF energy application resulted in isthmus block.  Two bonus RF energy applications were then delivered, and the patient  was observed for 30 minutes -- and during this time had no recurrent  atrial flutter isthmus conduction.  The catheters were then removed.  Hemostasis was assured and the patient was returned to her room in  satisfactory condition.   It should be noted that prior to removal of the catheters, rapid  ventricular pacing was carried out from the RV apex -- demonstrating VA  Wenckebach at 470 milliseconds.  Programmed ventricular stimulation was  carried out at basic drive cycle length of 536 milliseconds,  demonstrating retrograde AV node ERP at 600/400.  Programmed atrial  stimulation was carried out from the coronary sinus, also demonstrating  an AV node ERP of 600/320 and with no inducible arrhythmias.   COMPLICATIONS:  There were no immediate procedure complications.   RESULTS:  1. Baseline echocardiogram.  Baseline ECG demonstrates sinus rhythm,      normal axis intervals.  Prior ECG demonstrated atrial flutter with      2:1 AV conduction.  2. Baseline intervals:  The H-V interval was 37 milliseconds.  The      sinus node cycle length was approximately 900 milliseconds.  3. Rapid ventricular pacing:  Rapid ventricular pacing following      ablation demonstrated VA Wenckebach at 470 milliseconds.  During      rapid ventricular pacing the atrial activation was midline and      decremental.  4. Programmed ventricular stimulation:  Programmed ventricular      stimulation  was carried out from the RV apex at a basic drive cycle      length of 600 milliseconds.  The S1-S2 interval was stepwise      decreased down to 400 milliseconds, where the retrograde AV node      ERP was observed.  During programmed ventricular stimulation the      atrial activation was midline and decremental.  5. Rapid atrial pacing:  Rapid atrial pacing was carried out from the      coronary sinus as well as the high right atrium, at a pacing cycle      length of 600 milliseconds that was stepwise decreased down to 410      milliseconds -- where AV Wenckebach was observed.  Additional      decrements were then carried out down to 210 milliseconds,      resulting in the initiation of atrial flutter.  6. Programmed atrial stimulation:  Programmed atrial stimulation was      carried out from the coronary sinus as well as the high right      atrium, at basic drive cycle length of 045 milliseconds.  The S1-S2      interval was stepwise decreased down to 320 milliseconds, resulting      in the AV node ERP.  During programmed atrial stimulation there      were no AH jumps and no echo beats noted.  7. Rhythms Observed:      a.     Atrial flutter.  Initiation of rapid atrial pacing duration       was sustained; cycle length 230 milliseconds.      b.     Method of termination was catheter ablation.  8. Mapping:  Mapping of atrial flutter isthmus demonstrated a small      atrial flutter isthmus with the usual size and usual orientation.  9. RF energy application:  A total of 3 RF energy applications were      delivered.  During the first RF energy application, sinus rhythm      was restored and isthmus block was created.  Two bonus RF energy      applications were then delivered.  The patient was observed for 30      minutes, with no isthmus conduction and no inducible SVT.   CONCLUSION:  This study demonstrates successful electrophysiologic study and RF catheter ablation of typical atrial  flutter, with a total of 3 RF  energy applications delivered to the usual atrial flutter isthmus --  resulting in the termination of atrial flutter, restoration of sinus  rhythm, creation of bidirectional block and atrial flutter isthmus.  Doylene Canning. [name], MD  Electronically Signed     GWT/MEDQ  D:  05/07/2006  T:  05/07/2006  Job:  [id number]   cc:   Rollene Rotunda, MD, [facility]  Georgina Quint Plotnikov, MD

## 2010-09-12 ENCOUNTER — Emergency Department (HOSPITAL_COMMUNITY)
Admission: EM | Admit: 2010-09-12 | Discharge: 2010-09-12 | Disposition: A | Discharge status: Home / Self Care | Payer: Medicare Other | Attending: Emergency Medicine | Admitting: Emergency Medicine

## 2010-09-12 ENCOUNTER — Telehealth: Payer: Self-pay | Admitting: *Deleted

## 2010-09-12 DIAGNOSIS — E119 Type 2 diabetes mellitus without complications: Secondary | ICD-10-CM | POA: Insufficient documentation

## 2010-09-12 DIAGNOSIS — I10 Essential (primary) hypertension: Secondary | ICD-10-CM | POA: Insufficient documentation

## 2010-09-12 DIAGNOSIS — N390 Urinary tract infection, site not specified: Secondary | ICD-10-CM | POA: Insufficient documentation

## 2010-09-12 LAB — POCT I-STAT, CHEM 8
BUN: 24 mg/dL — ABNORMAL HIGH (ref 6–23)
Calcium, Ion: 1.09 mmol/L — ABNORMAL LOW (ref 1.12–1.32)
Creatinine, Ser: 1.2 mg/dL (ref 0.4–1.2)
Glucose, Bld: 111 mg/dL — ABNORMAL HIGH (ref 70–99)
Hemoglobin: 14.3 g/dL (ref 12.0–15.0)
Sodium: 140 mEq/L (ref 135–145)
TCO2: 24 mmol/L (ref 0–100)

## 2010-09-12 LAB — URINALYSIS, ROUTINE W REFLEX MICROSCOPIC
Bilirubin Urine: NEGATIVE
Glucose, UA: NEGATIVE mg/dL
Ketones, ur: NEGATIVE mg/dL
Protein, ur: NEGATIVE mg/dL
Urobilinogen, UA: 0.2 mg/dL (ref 0.0–1.0)

## 2010-09-12 LAB — URINE MICROSCOPIC-ADD ON

## 2010-09-12 NOTE — Telephone Encounter (Signed)
rec call stating pt's BS is 320 after her am dose of insulin and she is lethargic.  Per Dr. Posey Rea I faxed order to:  Give 8 units of levemir now  Check UA  And take to ER if lethargy persists.  To fax # [phone]

## 2010-09-14 LAB — URINE CULTURE: Culture  Setup Time: 201205260051

## 2010-09-15 ENCOUNTER — Telehealth: Payer: Self-pay | Admitting: Internal Medicine

## 2010-09-15 NOTE — Telephone Encounter (Signed)
Was urine cultured? Any sx? Thx

## 2010-09-17 NOTE — Telephone Encounter (Signed)
She was told she had UTI and was given Cipro. She is not expressing any current symptoms/complaints. However, it was cultured and it shows bacteria is resistant to Cipro. Do you want to change med? If yes, fax order to [phone].

## 2010-09-19 ENCOUNTER — Telehealth: Payer: Self-pay | Admitting: *Deleted

## 2010-09-19 DIAGNOSIS — D510 Vitamin B12 deficiency anemia due to intrinsic factor deficiency: Secondary | ICD-10-CM

## 2010-09-19 DIAGNOSIS — L8992 Pressure ulcer of unspecified site, stage 2: Secondary | ICD-10-CM

## 2010-09-19 DIAGNOSIS — E119 Type 2 diabetes mellitus without complications: Secondary | ICD-10-CM

## 2010-09-19 DIAGNOSIS — L89309 Pressure ulcer of unspecified buttock, unspecified stage: Secondary | ICD-10-CM

## 2010-09-19 MED ORDER — SULFAMETHOXAZOLE-TRIMETHOPRIM 800-160 MG PO TABS: 1.0000 | ORAL_TABLET | Freq: Two times a day (BID) | ORAL | Status: AC

## 2010-09-19 NOTE — Telephone Encounter (Signed)
Assisted living called req MD to review urine culture b/c they state pt "does not feel well" Order for abx faxed to [phone]

## 2010-09-19 NOTE — Telephone Encounter (Signed)
Bactrim DS bid x 14 d is done Thx

## 2010-09-19 NOTE — Telephone Encounter (Signed)
Given Bactrim DS bid x 14 d

## 2010-09-22 ENCOUNTER — Telehealth: Payer: Self-pay

## 2010-09-22 NOTE — Telephone Encounter (Signed)
[facility] Triage Call Report Triage Record Num: [id number] Operator: Kathleen Lime Patient Name: [name] Call Date & Time: 09/20/2010 9:07:16AM Patient Phone: [phone] PCP: Sonda Primes Patient Gender: Female PCP Fax : [phone] Patient DOB: 1916-08-18 [facility] Name[facility] Reason for Call: (see 2 previous notes)Contacted office in the am 6/2. New rx was written in chart, per RN rx is Bactrim DS 800-160mg  take one tablet po daily for 14 days. Plotnikov. Order faxed to facility. Contact Harriett Sine. PCP is Plotnikov, Alex. Callback number is [phone]. Protocol(s) Used: Office Note Recommended Outcome per Protocol: Information Noted and Sent to Office Reason for Outcome: Caller information to office Care Advice: ~ 09/20/2010 9:19:36AM Page 1 of 1 CAN_TriageRpt_V2

## 2010-09-22 NOTE — Telephone Encounter (Signed)
[facility] Triage Call Report Triage Record Num: [id number] Operator: Kathleen Lime Patient Name: [name] Call Date & Time: 09/19/2010 12:06:31PM Patient Phone: [phone] PCP: Sonda Primes Patient Gender: Female PCP Fax : [phone] Patient DOB: Aug 11, 1916 [facility] Name[facility] Reason for Call: [facility], Med tech called. Pt was dx with UTI while in hospital for Hyperglycemia. She is now acting more confused, blood sugar 253 10:50, 238 12:17. Levamir 22u and 12 units in the evening. She has been taking Cipro for the UTI but the assisted living received a call yesterday that the abx was going to be changed, no order was received. Order should be faxed to [phone]. Emergent sxs for Diabetes: Control Problems Protocol R/O Called and spoke to Dr Delice Bison, he will give information to the office after 1pm when they are back from a meeting. Protocol(s) Used: Diabetes: Control Problems Recommended Outcome per Protocol: Call Provider Immediately Reason for Outcome: Blood sugar over 400 mg/dl when tested before breakfast Care Advice: ~ IMMEDIATE ACTION Check for Ketones Now: - If blood sugar more than 250 mg/dl check for ketones before calling provider. - Tell provider results or take log of recent results to provider visit. ~ See another provider immediately if unable to talk with your provider within 1 hour. Consider the closest urgent care or ED if an office or clinic is not available. Another adult should drive. ~ ~ Call EMS 911 if any loss of consciousness, difficult to awaken, slow to respond, or new onset of confused thinking. Hyperglycemia: - Drink extra water to help prevent hyperglycemia and dehydration. - Do not exercise when blood sugar is high or urine ketones are present. ~ 09/19/2010 12:27:55PM Page 1 of 1 CAN_TriageRpt_V2

## 2010-10-06 ENCOUNTER — Other Ambulatory Visit: Payer: Self-pay | Admitting: Internal Medicine

## 2010-10-08 ENCOUNTER — Ambulatory Visit: Payer: Medicare Other | Admitting: Internal Medicine

## 2010-10-08 ENCOUNTER — Encounter: Payer: Self-pay | Admitting: Internal Medicine

## 2010-10-08 ENCOUNTER — Ambulatory Visit (INDEPENDENT_AMBULATORY_CARE_PROVIDER_SITE_OTHER): Payer: Medicare Other | Admitting: Internal Medicine

## 2010-10-08 DIAGNOSIS — F068 Other specified mental disorders due to known physiological condition: Secondary | ICD-10-CM

## 2010-10-08 DIAGNOSIS — K5900 Constipation, unspecified: Secondary | ICD-10-CM | POA: Insufficient documentation

## 2010-10-08 DIAGNOSIS — E119 Type 2 diabetes mellitus without complications: Secondary | ICD-10-CM

## 2010-10-08 MED ORDER — SENNOSIDES 25 MG PO TABS: 1.0000 | ORAL_TABLET | Freq: Two times a day (BID) | ORAL | Status: DC | PRN

## 2010-10-08 NOTE — Assessment & Plan Note (Signed)
Cont Rx 

## 2010-10-08 NOTE — Progress Notes (Signed)
  Subjective:    Patient ID: [name], female    DOB: Dec 26, 1916, 77 y.o.   MRN: [id number]  HPI The patient presents for a follow-up of  chronic hypertension, chronic dyslipidemia, type 2 diabetes, dementia controlled with medicines partially C/o constipation at times     Review of Systems  Constitutional: Positive for fatigue. Negative for chills, activity change, appetite change and unexpected weight change.  HENT: Negative for congestion, mouth sores and sinus pressure.   Eyes: Negative for visual disturbance.  Respiratory: Negative for cough and chest tightness.   Gastrointestinal: Positive for constipation. Negative for nausea, vomiting, abdominal pain, blood in stool and anal bleeding.  Genitourinary: Negative for frequency, difficulty urinating and vaginal pain.  Musculoskeletal: Negative for back pain and gait problem.  Skin: Negative for pallor and rash.  Neurological: Negative for dizziness, tremors, weakness, numbness and headaches.  Psychiatric/Behavioral: Positive for confusion and decreased concentration. Negative for suicidal ideas, hallucinations, behavioral problems, sleep disturbance, self-injury and dysphoric mood. The patient is not nervous/anxious and is not hyperactive.        Objective:   Physical Exam  Constitutional: She appears well-developed. No distress.       Obese  HENT:  Head: Normocephalic.  Right Ear: External ear normal.  Left Ear: External ear normal.  Nose: Nose normal.  Mouth/Throat: Oropharynx is clear and moist.  Eyes: Conjunctivae are normal. Pupils are equal, round, and reactive to light. Right eye exhibits no discharge. Left eye exhibits no discharge.  Neck: Normal range of motion. Neck supple. No JVD present. No tracheal deviation present. No thyromegaly present.  Cardiovascular: Normal rate, regular rhythm and normal heart sounds.   Pulmonary/Chest: No stridor. No respiratory distress. She has no wheezes.  Abdominal: Soft. Bowel  sounds are normal. She exhibits no distension and no mass. There is no tenderness. There is no rebound and no guarding.  Musculoskeletal: She exhibits no edema and no tenderness.  Lymphadenopathy:    She has no cervical adenopathy.  Neurological: She displays normal reflexes. No cranial nerve deficit. She exhibits normal muscle tone. Coordination normal.  Skin: No rash noted. No erythema.  Psychiatric: She has a normal mood and affect. Thought content normal.       Apathetic, sleepy          Assessment & Plan:

## 2010-10-08 NOTE — Assessment & Plan Note (Signed)
Will use sena prn

## 2010-11-13 ENCOUNTER — Other Ambulatory Visit: Payer: Self-pay | Admitting: Internal Medicine

## 2010-12-08 ENCOUNTER — Other Ambulatory Visit: Payer: Self-pay | Admitting: Internal Medicine

## 2010-12-11 ENCOUNTER — Other Ambulatory Visit: Payer: Self-pay | Admitting: Internal Medicine

## 2010-12-15 ENCOUNTER — Telehealth: Payer: Self-pay

## 2010-12-15 NOTE — Telephone Encounter (Signed)
[facility] Triage Call Report Triage Record Num: [id number] Operator: Karenann Cai Patient Name: [name] Call Date & Time: 12/14/2010 2:37:34PM Patient Phone: [phone] PCP: Sonda Primes Patient Gender: Female PCP Fax : [phone] Patient DOB: [birth year] [facility] Name[facility] Reason for Call: Westley Hummer, Facility Nurse is calling to report that patient has fever and vomiting. Fever: 101.1 (tympanic). Patient has vomitied x 2 today. Patient last tolerated fluids at 12:30. Blood sugar=97 at 8:15. RN reviewed diabetes gastrointestinal problems care advice with Westley Hummer, Facility Nurse. RN authorized Zofran 4 mg, 1 po every 8 hours (#3) as written in office protocol. Charlene, Facility Nurse advised to call back anytime and to contact PCP in the morning if patient's symptoms have not resolved. Protocol(s) Used: Diabetes: Gastrointestinal Problems Protocol(s) Used: Nausea or Vomiting Recommended Outcome per Protocol: See Provider within 72 Hours Reason for Outcome: Known diabetic All other situations Care Advice: Nausea Care Advice: - Drink small amounts of clear, sweetened liquids or ice cold drinks. - Eat light, bland foods such as saltine crackers or plain bread. - Do not eat high fat, highly seasoned, high fiber, or high sugar content foods. - Avoid mixing hot food and cold foods. - Eat smaller, more frequent meals. - Rest as much as possible in a sitting or in a propped lying position. Do not lie flat for at least 2 hours after eating. - Do not take pain medication (such as aspirin, NSAIDs) while nauseated. - Rest as much as possible until symptoms improve since activity may worsen nausea. ~ Blood Sugar and Ketone Testing: - Check blood sugar as recommended, and more often whenever it has been high or low, any change to the treatment plan, increased stress, illness, infection, or a change to the regular schedule. - Test for urine ketones anytime the blood sugar is  above 250 mg/dl, when there is stress, acute illness, nausea, vomiting, or abdominal pain. - Follow action plan for illness. - Record blood sugar and ketones in meter log or separate logbook, and take to all provider visits. - Drink extra water and other non-sugar fluids to prevent dehydration when the blood sugar is high and urine ketones are present. ~ Diabetes Action Plan: - Follow recommended action plan and diet - Check blood sugar as recommended - Take diabetic pills or insulin as ordered - Follow action plan for illness. ~ 12/14/2010 2:52:08PM Page 1 of 1 CAN_TriageRpt_V2

## 2011-01-07 ENCOUNTER — Other Ambulatory Visit (INDEPENDENT_AMBULATORY_CARE_PROVIDER_SITE_OTHER): Payer: Medicare Other

## 2011-01-07 DIAGNOSIS — K5900 Constipation, unspecified: Secondary | ICD-10-CM

## 2011-01-07 DIAGNOSIS — E119 Type 2 diabetes mellitus without complications: Secondary | ICD-10-CM

## 2011-01-07 DIAGNOSIS — F068 Other specified mental disorders due to known physiological condition: Secondary | ICD-10-CM

## 2011-01-07 LAB — COMPREHENSIVE METABOLIC PANEL
ALT: 20 U/L (ref 0–35)
AST: 22 U/L (ref 0–37)
Albumin: 3.4 g/dL — ABNORMAL LOW (ref 3.5–5.2)
BUN: 19 mg/dL (ref 6–23)
CO2: 29 mEq/L (ref 19–32)
Calcium: 8.7 mg/dL (ref 8.4–10.5)
Chloride: 108 mEq/L (ref 96–112)
GFR: 73.41 mL/min (ref >60.00)
Potassium: 3.9 mEq/L (ref 3.5–5.1)

## 2011-01-07 LAB — HEMOGLOBIN A1C: Hgb A1c MFr Bld: 9.7 % — ABNORMAL HIGH (ref 4.6–6.5)

## 2011-01-12 ENCOUNTER — Other Ambulatory Visit: Payer: Self-pay | Admitting: *Deleted

## 2011-01-12 ENCOUNTER — Encounter: Payer: Self-pay | Admitting: Internal Medicine

## 2011-01-12 ENCOUNTER — Ambulatory Visit (INDEPENDENT_AMBULATORY_CARE_PROVIDER_SITE_OTHER): Payer: Medicare Other | Admitting: Internal Medicine

## 2011-01-12 VITALS — BP 120/62 | HR 76 | Temp 98.5°F | Resp 16 | Wt 160.0 lb

## 2011-01-12 DIAGNOSIS — E119 Type 2 diabetes mellitus without complications: Secondary | ICD-10-CM

## 2011-01-12 DIAGNOSIS — E538 Deficiency of other specified B group vitamins: Secondary | ICD-10-CM

## 2011-01-12 DIAGNOSIS — F068 Other specified mental disorders due to known physiological condition: Secondary | ICD-10-CM

## 2011-01-12 DIAGNOSIS — N259 Disorder resulting from impaired renal tubular function, unspecified: Secondary | ICD-10-CM

## 2011-01-12 MED ORDER — ICAPS MV PO TABS: 1.0000 | ORAL_TABLET | Freq: Two times a day (BID) | ORAL | Status: DC

## 2011-01-12 NOTE — Assessment & Plan Note (Signed)
Advanced/moderate-to-severe Getting worse Continue with current prescription therapy as reflected on the Med list.

## 2011-01-12 NOTE — Progress Notes (Signed)
Subjective:    Patient ID: [name], female    DOB: 04-25-1916, 77 y.o.   MRN: [id number]  HPI The patient presents for a follow-up of  chronic hypertension, chronic dementia, type 2 diabetes controlled with medicines   Past Medical History  Diagnosis Date  . 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 Esophageal reflux   . Osteoporosis, unspecified   . Type II or unspecified type diabetes mellitus with ophthalmic manifestations, not stated as uncontrolled   . Chronic kidney disease, unspecified   . Other and unspecified hyperlipidemia   . Type II or unspecified type diabetes mellitus without mention of complication, not stated as uncontrolled   . Mitral valve insufficiency and aortic valve insufficiency   . Acute myocardial infarction, unspecified site, episode of care unspecified   . Atrial fibrillation   . Peptic ulcer, unspecified site, unspecified as acute or chronic, without mention of hemorrhage, perforation, or obstruction   . 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 Esophageal reflux   . Osteoporosis, unspecified   . Type II or unspecified type diabetes mellitus with ophthalmic manifestations, not stated as uncontrolled   . Chronic kidney disease, unspecified   . Type II or unspecified type diabetes mellitus without mention of complication, not stated as uncontrolled   . Mitral valve insufficiency and aortic valve insufficiency   . Acute myocardial infarction, unspecified site, episode of care unspecified   . Atrial fibrillation   . Hypertension   . Renal insufficiency   . Stasis dermatitis 2010    Dr. Terri Piedra   History reviewed. No pertinent past surgical history.  reports that she has never smoked. She does not have any smokeless tobacco history on file. She reports that she does not drink alcohol or use illicit drugs. family history includes Diabetes in her mother and Hypertension in her father,  mother, and other. Allergies  Allergen Reactions  . Penicillins    Current Outpatient Prescriptions on File Prior to Visit  Medication Sig Dispense Refill  . AMARYL 4 MG tablet TAKE 1 TABLET BY MOUTH   EACH MORNING.  90 each  1  . aspirin 81 MG tablet Take 81 mg by mouth daily.        . Calcium Carbonate-Vitamin D (CALCIUM + D) 600-200 MG-UNIT TABS Take by mouth.        . COLACE 100 MG capsule TAKE 1 CAPSULE BY MOUTH  TWICE DAILY.  60 each  5  . cyanocobalamin (,VITAMIN B-12,) 1000 MCG/ML injection Inject 1,000 mcg into the muscle once.        Marland Kitchen dextromethorphan (DELSYM) 30 MG/5ML liquid Take 30 mg by mouth 2 (two) times daily.        Marland Kitchen esomeprazole (NEXIUM) 40 MG capsule Take 40 mg by mouth daily before breakfast.        . furosemide (LASIX) 20 MG tablet Take 1 tablet (20 mg total) by mouth every morning.  30 tablet  5  . insulin detemir (LEVEMIR) 100 UNIT/ML injection Inject into the skin as directed. 22 units in the morning and 12 units in evening       . Insulin Pen Needle (PC UNIFINE PENTIPS) 29G X MISC Inject 1 each into the muscle as directed.  100 each  3  . loperamide (QC ANTI-DIARRHEAL) 2 MG tablet Take 2 mg by mouth. Take 2 after 1st loose stool, then 1 after each loose stool (max 8 per 24 hours)  as needed       . loratadine (CLARITIN) 10 MG tablet Take 1 tablet (10 mg total) by mouth daily.  30 tablet  5  . nystatin-triamcinolone (MYCOLOG II) cream PROVIDE AND APPLY TO LEFTAND RIGHT INNER BUTTOCKTWICE DAILY UNTIL        RESOLVED, THEN AS NEEDED.  30 g  2  . Olopatadine HCl (PATADAY) 0.2 % SOLN Apply 1 drop to eye as needed. For itchy eyes       . pravastatin (PRAVACHOL) 40 MG tablet Take 40 mg by mouth daily.        . promethazine (PHENERGAN) 25 MG tablet Take 25 mg by mouth as needed.        Marland Kitchen REMINYL 12 MG tablet TAKE (1) TABLET BY MOUTH TWICE DAILY.  60 each  1  . Sennosides (SENNA LAXATIVE) 25 MG TABS Take 1 tablet (25 mg total) by mouth 2 (two) times daily as needed (for  constipation).  60 each  4  . Skin Protectants, Misc. (WHITE PETROLATUM-ZINC OXIDE) cream Apply topically as needed. Use qd-bid on buttock sores  98 g  0  . triamcinolone (KENALOG) 0.1 % cream Apply 1 application topically 2 (two) times daily.        Marland Kitchen triamcinolone (KENALOG) 0.5 % cream Apply 1 application topically 2 (two) times daily as needed.        . Wheat Dextrin (BENEFIBER) POWD MIX 2 TEASPOONSFUL IN 4-8OUNCES OF WATER AND DRINKDAILY.  245 g  3     Review of Systems  Constitutional: Negative for chills, activity change, appetite change, fatigue and unexpected weight change.  HENT: Negative for congestion, mouth sores and sinus pressure.   Eyes: Positive for visual disturbance.  Respiratory: Negative for cough and chest tightness.   Gastrointestinal: Negative for nausea and abdominal pain.  Genitourinary: Positive for urgency and enuresis. Negative for frequency, difficulty urinating and vaginal pain.  Musculoskeletal: Negative for back pain and gait problem.  Skin: Negative for pallor and rash.  Neurological: Negative for dizziness, tremors, weakness, numbness and headaches.  Psychiatric/Behavioral: Positive for confusion and decreased concentration. Negative for sleep disturbance and agitation. The patient is not nervous/anxious.    Wt Readings from Last 3 Encounters:  01/12/11 160 lb (72.576 kg)  10/08/10 157 lb (71.215 kg)  08/04/10 158 lb (71.668 kg)   BP Readings from Last 3 Encounters:  01/12/11 120/62  10/08/10 120/68  08/04/10 130/62   BP 120/62  Pulse 76  Temp(Src) 98.5 F (36.9 C) (Oral)  Resp 16  Wt 160 lb (72.576 kg)       Objective:   Physical Exam  Constitutional: She appears well-developed. No distress.       obese  HENT:  Head: Normocephalic.  Right Ear: External ear normal.  Left Ear: External ear normal.  Nose: Nose normal.  Mouth/Throat: Oropharynx is clear and moist.  Eyes: Conjunctivae are normal. Pupils are equal, round, and reactive to  light. Right eye exhibits no discharge. Left eye exhibits no discharge.  Neck: Normal range of motion. Neck supple. No JVD present. No tracheal deviation present. No thyromegaly present.  Cardiovascular: Normal rate, regular rhythm and normal heart sounds.   Pulmonary/Chest: No stridor. No respiratory distress. She has no wheezes.  Abdominal: Soft. Bowel sounds are normal. She exhibits no distension and no mass. There is no tenderness. There is no rebound and no guarding.  Musculoskeletal: She exhibits no edema and no tenderness.  Lymphadenopathy:    She has no cervical adenopathy.  Neurological: She displays normal reflexes. No cranial nerve deficit. She exhibits normal muscle tone. Coordination normal.  Skin: No rash noted. No erythema.  Psychiatric: She has a normal mood and affect.       Disoriented, confused    Lab Results  Component Value Date   WBC 7.4 08/04/2010   HGB 14.3 09/12/2010   HCT 42.0 09/12/2010   PLT 235.0 08/04/2010   CHOL 140 07/10/2009   TRIG 127.0 07/10/2009   HDL 46.10 07/10/2009   ALT 20 01/07/2011   AST 22 01/07/2011   NA 144 01/07/2011   K 3.9 01/07/2011   CL 108 01/07/2011   CREATININE 0.8 01/07/2011   BUN 19 01/07/2011   CO2 29 01/07/2011   TSH 4.45 08/04/2010   HGBA1C 9.7* 01/07/2011         Assessment & Plan:

## 2011-01-12 NOTE — Assessment & Plan Note (Signed)
Continue with current prescription therapy as reflected on the Med list.  

## 2011-01-12 NOTE — Assessment & Plan Note (Signed)
Chronic poor control - the best we can do Continue with current prescription therapy as reflected on the Med list.

## 2011-01-12 NOTE — Patient Instructions (Signed)
Stay well! We will miss you!

## 2011-01-12 NOTE — Assessment & Plan Note (Signed)
Monitoring

## 2011-01-16 ENCOUNTER — Other Ambulatory Visit: Payer: Self-pay | Admitting: Internal Medicine

## 2011-01-20 DIAGNOSIS — E119 Type 2 diabetes mellitus without complications: Secondary | ICD-10-CM

## 2011-01-20 DIAGNOSIS — F0280 Dementia in other diseases classified elsewhere without behavioral disturbance: Secondary | ICD-10-CM

## 2011-01-20 DIAGNOSIS — D510 Vitamin B12 deficiency anemia due to intrinsic factor deficiency: Secondary | ICD-10-CM

## 2011-01-20 DIAGNOSIS — F0281 Dementia in other diseases classified elsewhere with behavioral disturbance: Secondary | ICD-10-CM

## 2011-01-20 DIAGNOSIS — G309 Alzheimer's disease, unspecified: Secondary | ICD-10-CM

## 2011-01-28 ENCOUNTER — Other Ambulatory Visit: Payer: Self-pay | Admitting: Internal Medicine

## 2011-02-05 ENCOUNTER — Other Ambulatory Visit: Payer: Self-pay | Admitting: *Deleted

## 2011-02-05 MED ORDER — CYANOCOBALAMIN 1000 MCG/ML IJ SOLN: 1000.0000 ug | Freq: Once | INTRAMUSCULAR | Status: DC

## 2011-02-10 ENCOUNTER — Other Ambulatory Visit: Payer: Self-pay | Admitting: Internal Medicine

## 2011-02-13 ENCOUNTER — Other Ambulatory Visit: Payer: Self-pay | Admitting: Internal Medicine

## 2011-02-17 DIAGNOSIS — F0281 Dementia in other diseases classified elsewhere with behavioral disturbance: Secondary | ICD-10-CM

## 2011-02-17 DIAGNOSIS — F0280 Dementia in other diseases classified elsewhere without behavioral disturbance: Secondary | ICD-10-CM

## 2011-02-17 DIAGNOSIS — D510 Vitamin B12 deficiency anemia due to intrinsic factor deficiency: Secondary | ICD-10-CM

## 2011-02-17 DIAGNOSIS — E119 Type 2 diabetes mellitus without complications: Secondary | ICD-10-CM

## 2011-02-17 DIAGNOSIS — G309 Alzheimer's disease, unspecified: Secondary | ICD-10-CM

## 2011-03-16 ENCOUNTER — Other Ambulatory Visit: Payer: Self-pay | Admitting: *Deleted

## 2011-03-16 MED ORDER — ESOMEPRAZOLE MAGNESIUM 40 MG PO CPDR: 40.0000 mg | DELAYED_RELEASE_CAPSULE | Freq: Every day | ORAL | Status: DC

## 2011-03-17 ENCOUNTER — Telehealth: Payer: Self-pay | Admitting: Internal Medicine

## 2011-03-17 NOTE — Telephone Encounter (Signed)
Rx called in for her nexium to her pharmacy.

## 2011-03-19 DIAGNOSIS — E119 Type 2 diabetes mellitus without complications: Secondary | ICD-10-CM

## 2011-03-19 DIAGNOSIS — D510 Vitamin B12 deficiency anemia due to intrinsic factor deficiency: Secondary | ICD-10-CM

## 2011-03-19 DIAGNOSIS — R633 Feeding difficulties: Secondary | ICD-10-CM

## 2011-03-19 DIAGNOSIS — R1311 Dysphagia, oral phase: Secondary | ICD-10-CM

## 2011-03-20 ENCOUNTER — Other Ambulatory Visit: Payer: Self-pay | Admitting: *Deleted

## 2011-03-20 MED ORDER — NYSTATIN-TRIAMCINOLONE 100000-0.1 UNIT/GM-% EX CREA: TOPICAL_CREAM | Freq: Two times a day (BID) | CUTANEOUS | Status: DC

## 2011-03-31 ENCOUNTER — Encounter (HOSPITAL_COMMUNITY): Payer: Self-pay | Admitting: Emergency Medicine

## 2011-03-31 ENCOUNTER — Emergency Department (HOSPITAL_COMMUNITY)
Admission: EM | Admit: 2011-03-31 | Discharge: 2011-03-31 | Disposition: A | Discharge status: Hospice | Payer: Medicare Other | Attending: Emergency Medicine | Admitting: Emergency Medicine

## 2011-03-31 DIAGNOSIS — Z794 Long term (current) use of insulin: Secondary | ICD-10-CM | POA: Insufficient documentation

## 2011-03-31 DIAGNOSIS — E11319 Type 2 diabetes mellitus with unspecified diabetic retinopathy without macular edema: Secondary | ICD-10-CM | POA: Insufficient documentation

## 2011-03-31 DIAGNOSIS — K219 Gastro-esophageal reflux disease without esophagitis: Secondary | ICD-10-CM | POA: Insufficient documentation

## 2011-03-31 DIAGNOSIS — W19XXXA Unspecified fall, initial encounter: Secondary | ICD-10-CM

## 2011-03-31 DIAGNOSIS — I4891 Unspecified atrial fibrillation: Secondary | ICD-10-CM | POA: Insufficient documentation

## 2011-03-31 DIAGNOSIS — F068 Other specified mental disorders due to known physiological condition: Secondary | ICD-10-CM | POA: Insufficient documentation

## 2011-03-31 DIAGNOSIS — E785 Hyperlipidemia, unspecified: Secondary | ICD-10-CM | POA: Insufficient documentation

## 2011-03-31 DIAGNOSIS — W1809XA Striking against other object with subsequent fall, initial encounter: Secondary | ICD-10-CM | POA: Insufficient documentation

## 2011-03-31 DIAGNOSIS — Z043 Encounter for examination and observation following other accident: Secondary | ICD-10-CM | POA: Insufficient documentation

## 2011-03-31 DIAGNOSIS — I252 Old myocardial infarction: Secondary | ICD-10-CM | POA: Insufficient documentation

## 2011-03-31 DIAGNOSIS — N189 Chronic kidney disease, unspecified: Secondary | ICD-10-CM | POA: Insufficient documentation

## 2011-03-31 DIAGNOSIS — M810 Age-related osteoporosis without current pathological fracture: Secondary | ICD-10-CM | POA: Insufficient documentation

## 2011-03-31 DIAGNOSIS — E1139 Type 2 diabetes mellitus with other diabetic ophthalmic complication: Secondary | ICD-10-CM | POA: Insufficient documentation

## 2011-03-31 DIAGNOSIS — I129 Hypertensive chronic kidney disease with stage 1 through stage 4 chronic kidney disease, or unspecified chronic kidney disease: Secondary | ICD-10-CM | POA: Insufficient documentation

## 2011-03-31 NOTE — ED Notes (Signed)
Bed:WA06<BR> Expected date:<BR> Expected time:<BR> Means of arrival:<BR> Comments:<BR> Ems/fall

## 2011-03-31 NOTE — ED Provider Notes (Signed)
History     CSN: [id number] Arrival date & time: 03/31/2011  3:38 PM   First MD Initiated Contact with Patient 03/31/11 1544      Chief Complaint  Patient presents with  . Fall    (Consider location/radiation/quality/duration/timing/severity/associated sxs/prior treatment) HPI Comments: The patient is a 77 year old female with advanced dementia who was ambulating at the nursing home, was walking away from a group of people, when she stumbled and had a witnessed fall to the ground. There was no significant head injury or loss of consciousness per EMS report. The patient was brought in on a long spine board in full spinal immobilization, awake, alert, and oriented to person only which appears to be her baseline. She denies any pain or injury anywhere. Specifically she denies any pain at the head, neck, hips, pelvis, extremities, chest, or abdomen. Her spine is nontender to palpation without deformities. Her head is atraumatic and actually, on evaluation I do not see any signs of injury. The patient is moving all extremities spontaneously.  Patient is a 77 y.o. female presenting with fall. The history is provided by the patient, medical records, the nursing home and the EMS personnel. The history is limited by a developmental delay (Advanced dementia).  Fall The accident occurred less than 1 hour ago. The fall occurred while walking. She fell from a height of 1 to 2 ft. She landed on a hard floor. There was no blood loss. Point of impact: Unknown, this information is not available. Pain location: No pain. The pain is at a severity of 0/10. The patient is experiencing no pain. There was no entrapment after the fall. There was no drug use involved in the accident. There was no alcohol use involved in the accident. Pertinent negatives include no abdominal pain, no vomiting, no headaches and no loss of consciousness. Exacerbated by: Nothing. Treatment on scene includes a c-collar and a backboard.     Past Medical History  Diagnosis Date  . 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 Esophageal reflux   . Osteoporosis, unspecified   . Type II or unspecified type diabetes mellitus with ophthalmic manifestations, not stated as uncontrolled   . Chronic kidney disease, unspecified   . Other and unspecified hyperlipidemia   . Type II or unspecified type diabetes mellitus without mention of complication, not stated as uncontrolled   . Mitral valve insufficiency and aortic valve insufficiency   . Acute myocardial infarction, unspecified site, episode of care unspecified   . Atrial fibrillation   . Peptic ulcer, unspecified site, unspecified as acute or chronic, without mention of hemorrhage, perforation, or obstruction   . 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 Esophageal reflux   . Osteoporosis, unspecified   . Type II or unspecified type diabetes mellitus with ophthalmic manifestations, not stated as uncontrolled   . Chronic kidney disease, unspecified   . Type II or unspecified type diabetes mellitus without mention of complication, not stated as uncontrolled   . Mitral valve insufficiency and aortic valve insufficiency   . Acute myocardial infarction, unspecified site, episode of care unspecified   . Atrial fibrillation   . Hypertension   . Renal insufficiency   . Stasis dermatitis 2010    Dr. Terri Piedra    History reviewed. No pertinent past surgical history.  Family History  Problem Relation Age of Onset  . Hypertension Other   . Hypertension Mother   . Diabetes Mother   .  Hypertension Father     History  Substance Use Topics  . Smoking status: Never Smoker   . Smokeless tobacco: Not on file  . Alcohol Use: No    OB History    Grav Para Term Preterm Abortions TAB SAB Ect Mult Living                  Review of Systems  Unable to perform ROS: Dementia  Gastrointestinal: Negative for vomiting  and abdominal pain.  Neurological: Negative for loss of consciousness and headaches.    Allergies  Penicillins  Home Medications   Current Outpatient Rx  Name Route Sig Dispense Refill  . AMARYL 4 MG PO TABS  TAKE 1 TABLET BY MOUTH   EACH MORNING. 90 each 2  . ASPIRIN 81 MG PO TABS Oral Take 81 mg by mouth daily.      Marland Kitchen CALCIUM CARBONATE-VITAMIN D 600-200 MG-UNIT PO TABS Oral Take by mouth.      Marland Kitchen CLARITIN 10 MG PO TABS  TAKE 1 TABLET BY MOUTH ONCE DAILY. 90 each 3  . COLACE 100 MG PO CAPS  TAKE 1 CAPSULE BY MOUTH  TWICE DAILY. 60 each 5  . CYANOCOBALAMIN 1000 MCG/ML IJ SOLN Intramuscular Inject 1 mL (1,000 mcg total) into the muscle once. 1 mL 5  . DEXTROMETHORPHAN POLISTIREX ER 30 MG/5ML PO LQCR Oral Take 30 mg by mouth 2 (two) times daily.      Marland Kitchen ESOMEPRAZOLE MAGNESIUM 40 MG PO CPDR Oral Take 1 capsule (40 mg total) by mouth daily before breakfast. 30 capsule 5  . INSULIN DETEMIR 100 UNIT/ML [place] SOLN Subcutaneous Inject into the skin as directed. 22 units in the morning and 12 units in evening     . INSULIN PEN NEEDLE 29G X MISC Intramuscular Inject 1 each into the muscle as directed. 100 each 3  . LASIX 20 MG PO TABS  TAKE 1 TABLET BY MOUTH   EACH MORNING. 30 each 5  . LOPERAMIDE HCL 2 MG PO TABS Oral Take 2 mg by mouth. Take 2 after 1st loose stool, then 1 after each loose stool (max 8 per 24 hours) as needed     . ICAPS MV PO TABS Oral Take 1 tablet by mouth 2 (two) times daily with a meal. 60 each 5  . NYSTATIN-TRIAMCINOLONE 100000-0.1 UNIT/GM-% EX CREA Topical Apply topically 2 (two) times daily. 30 g 1  . OLOPATADINE HCL 0.2 % OP SOLN Ophthalmic Apply 1 drop to eye as needed. For itchy eyes     . PRAVASTATIN SODIUM 40 MG PO TABS Oral Take 40 mg by mouth daily.      Marland Kitchen PROMETHAZINE HCL 25 MG PO TABS Oral Take 25 mg by mouth as needed.      Marland Kitchen REMINYL 12 MG PO TABS  TAKE (1) TABLET BY MOUTH TWICE DAILY. 60 each 5  . SENNOSIDES 25 MG PO TABS Oral Take 1 tablet (25 mg total) by  mouth 2 (two) times daily as needed (for constipation). 60 each 4  . DERMAGRAN BC EX CREA Topical Apply topically as needed. Use qd-bid on buttock sores 98 g 0  . TRIAMCINOLONE ACETONIDE 0.1 % EX CREA Topical Apply 1 application topically 2 (two) times daily.      . TRIAMCINOLONE ACETONIDE 0.5 % EX CREA Topical Apply 1 application topically 2 (two) times daily as needed.      . BENEFIBER PO POWD  MIX 2 TEASPOONSFUL IN 4-8OUNCES OF WATER AND DRINKDAILY.  245 g 3    BP 137/60  Pulse 86  Temp(Src) 97.7 F (36.5 C) (Oral)  Resp 20  SpO2 98%  Physical Exam  Nursing note and vitals reviewed. Constitutional: She appears well-nourished. No distress.       The patient appears in no significant discomfort, awake, alert, and oriented to person only. She denies any pain or injury. There are no apparent injuries on examination.  HENT:  Head: Normocephalic and atraumatic. Head is without raccoon's eyes, without Battle's sign, without abrasion, without contusion, without laceration, without right periorbital erythema and without left periorbital erythema. No trismus in the jaw.  Right Ear: Tympanic membrane, external ear and ear canal normal. No drainage or tenderness. No mastoid tenderness. Tympanic membrane is not perforated. No hemotympanum.  Left Ear: Tympanic membrane, external ear and ear canal normal. No drainage or tenderness. No mastoid tenderness. Tympanic membrane is not perforated. No hemotympanum.  Nose: Nose normal. No mucosal edema, rhinorrhea, nose lacerations, sinus tenderness, nasal deformity, septal deviation or nasal septal hematoma. No epistaxis. Right sinus exhibits no maxillary sinus tenderness and no frontal sinus tenderness. Left sinus exhibits no maxillary sinus tenderness and no frontal sinus tenderness.  Mouth/Throat: Uvula is midline, oropharynx is clear and moist and mucous membranes are normal. No lacerations.  Eyes: Conjunctivae, EOM and lids are normal. Pupils are equal,  round, and reactive to light. Right eye exhibits no chemosis and no discharge. Left eye exhibits no chemosis and no discharge. Right conjunctiva is not injected. Right conjunctiva has no hemorrhage. Left conjunctiva is not injected. Left conjunctiva has no hemorrhage. Right eye exhibits normal extraocular motion. Left eye exhibits normal extraocular motion.  Neck: Trachea normal, normal range of motion and phonation normal. Neck supple. No JVD present. No tracheal tenderness, no spinous process tenderness and no muscular tenderness present. No tracheal deviation present.       Immobilized in C collar which was removed by me, followed by more detailed examination of the cervical spine which showed no deformity or tenderness to the posterior cervical vertebrae, no impairment of the active range of motion on behalf of the patient, and no pain with rotation to the left, the right, flexion of the neck, or extension of the head and neck. The cervical collar was therefore cleared by me.  Cardiovascular: Normal rate, regular rhythm, S1 normal, S2 normal, normal heart sounds and intact distal pulses.  Exam reveals no gallop, no distant heart sounds and no friction rub.   No murmur heard. Pulmonary/Chest: Effort normal and breath sounds normal. No accessory muscle usage or stridor. Not tachypneic. No respiratory distress. She has no decreased breath sounds. She has no wheezes. She has no rales. She exhibits no tenderness, no bony tenderness, no crepitus, no deformity and no retraction.  Abdominal: Soft. Normal appearance and bowel sounds are normal. She exhibits no distension, no pulsatile midline mass and no mass. There is no splenomegaly or hepatomegaly. There is no tenderness. There is no rigidity, no rebound, no guarding and no CVA tenderness.  Musculoskeletal: Normal range of motion. She exhibits no edema and no tenderness.       Right shoulder: Normal.       Left shoulder: Normal.       Right elbow: Normal.       Left elbow: Normal.       Right wrist: Normal.       Left wrist: Normal.       Right hip: She exhibits normal range of motion, normal  strength, no tenderness, no bony tenderness, no crepitus and no deformity.       Left hip: She exhibits normal range of motion, normal strength, no tenderness, no bony tenderness, no crepitus and no deformity.       Right knee: Normal.       Left knee: Normal.       Right ankle: Normal.       Left ankle: Normal.       Cervical back: She exhibits no tenderness, no bony tenderness, no swelling, no deformity and no pain.       Thoracic back: She exhibits no tenderness, no bony tenderness, no deformity and no pain.       Lumbar back: She exhibits no tenderness, no bony tenderness, no deformity and no pain.  Neurological: She is alert. She is disoriented. No cranial nerve deficit or sensory deficit. She exhibits normal muscle tone. GCS eye subscore is 4. GCS verbal subscore is 3. GCS motor subscore is 5.  Skin: Skin is warm and dry. Abrasion and bruising noted. No rash noted. She is not diaphoretic. No erythema. No pallor.  Psychiatric: She has a normal mood and affect. Her speech is normal and behavior is normal.    ED Course  Procedures (including critical care time)  Labs Reviewed - No data to display No results found.   No diagnosis found.    MDM  The patient has no indication of serious head injury, neck injury, back injury, extremity injury, traumatic, thorax, or abdominal injury. Her exam is essentially atraumatic. I don't find any indication for any radiographic studies and no indication for further treatment. I will discharge the patient back to the nursing facility.        Felisa Bonier, MD 03/31/11 [phone]

## 2011-03-31 NOTE — ED Notes (Signed)
Pt. Stumbled and fell at nursing facility.  Witnessed fall, no LOC. Pt. Has history of alzheimers

## 2011-04-09 ENCOUNTER — Other Ambulatory Visit: Payer: Self-pay | Admitting: Internal Medicine

## 2011-04-17 ENCOUNTER — Other Ambulatory Visit: Payer: Self-pay | Admitting: Internal Medicine

## 2011-08-13 ENCOUNTER — Other Ambulatory Visit: Payer: Self-pay | Admitting: Internal Medicine

## 2012-01-08 ENCOUNTER — Other Ambulatory Visit: Payer: Self-pay

## 2012-01-08 MED ORDER — DOCUSATE SODIUM 100 MG PO CAPS: ORAL_CAPSULE | ORAL | Status: DC

## 2012-04-02 ENCOUNTER — Emergency Department (HOSPITAL_COMMUNITY): Payer: Medicare Other

## 2012-04-02 ENCOUNTER — Encounter (HOSPITAL_COMMUNITY): Payer: Self-pay | Admitting: Emergency Medicine

## 2012-04-02 ENCOUNTER — Emergency Department (HOSPITAL_COMMUNITY)
Admission: EM | Admit: 2012-04-02 | Discharge: 2012-04-02 | Disposition: A | Discharge status: Home / Self Care | Payer: Medicare Other | Attending: Emergency Medicine | Admitting: Emergency Medicine

## 2012-04-02 DIAGNOSIS — Z8679 Personal history of other diseases of the circulatory system: Secondary | ICD-10-CM | POA: Insufficient documentation

## 2012-04-02 DIAGNOSIS — W06XXXA Fall from bed, initial encounter: Secondary | ICD-10-CM | POA: Insufficient documentation

## 2012-04-02 DIAGNOSIS — N189 Chronic kidney disease, unspecified: Secondary | ICD-10-CM | POA: Insufficient documentation

## 2012-04-02 DIAGNOSIS — Z7982 Long term (current) use of aspirin: Secondary | ICD-10-CM | POA: Insufficient documentation

## 2012-04-02 DIAGNOSIS — E785 Hyperlipidemia, unspecified: Secondary | ICD-10-CM | POA: Insufficient documentation

## 2012-04-02 DIAGNOSIS — Y939 Activity, unspecified: Secondary | ICD-10-CM | POA: Insufficient documentation

## 2012-04-02 DIAGNOSIS — S022XXA Fracture of nasal bones, initial encounter for closed fracture: Secondary | ICD-10-CM

## 2012-04-02 DIAGNOSIS — Z79899 Other long term (current) drug therapy: Secondary | ICD-10-CM | POA: Insufficient documentation

## 2012-04-02 DIAGNOSIS — Z8659 Personal history of other mental and behavioral disorders: Secondary | ICD-10-CM | POA: Insufficient documentation

## 2012-04-02 DIAGNOSIS — Z8739 Personal history of other diseases of the musculoskeletal system and connective tissue: Secondary | ICD-10-CM | POA: Insufficient documentation

## 2012-04-02 DIAGNOSIS — Z794 Long term (current) use of insulin: Secondary | ICD-10-CM | POA: Insufficient documentation

## 2012-04-02 DIAGNOSIS — Z8719 Personal history of other diseases of the digestive system: Secondary | ICD-10-CM | POA: Insufficient documentation

## 2012-04-02 DIAGNOSIS — Y9210 Unspecified residential institution as the place of occurrence of the external cause: Secondary | ICD-10-CM | POA: Insufficient documentation

## 2012-04-02 DIAGNOSIS — I252 Old myocardial infarction: Secondary | ICD-10-CM | POA: Insufficient documentation

## 2012-04-02 DIAGNOSIS — Z872 Personal history of diseases of the skin and subcutaneous tissue: Secondary | ICD-10-CM | POA: Insufficient documentation

## 2012-04-02 DIAGNOSIS — Z8711 Personal history of peptic ulcer disease: Secondary | ICD-10-CM | POA: Insufficient documentation

## 2012-04-02 DIAGNOSIS — W19XXXA Unspecified fall, initial encounter: Secondary | ICD-10-CM

## 2012-04-02 DIAGNOSIS — E1139 Type 2 diabetes mellitus with other diabetic ophthalmic complication: Secondary | ICD-10-CM | POA: Insufficient documentation

## 2012-04-02 DIAGNOSIS — I129 Hypertensive chronic kidney disease with stage 1 through stage 4 chronic kidney disease, or unspecified chronic kidney disease: Secondary | ICD-10-CM | POA: Insufficient documentation

## 2012-04-02 MED ORDER — ACETAMINOPHEN 325 MG PO TABS
650.0000 mg | ORAL_TABLET | Freq: Once | ORAL | Status: AC
  Administered 2012-04-02: 650 mg via ORAL
  Filled 2012-04-02: qty 2

## 2012-04-02 MED ORDER — TRAMADOL HCL 50 MG PO TABS: 50.0000 mg | ORAL_TABLET | Freq: Four times a day (QID) | ORAL | Status: DC | PRN

## 2012-04-02 NOTE — ED Notes (Signed)
Patient transported to CT and returned 

## 2012-04-02 NOTE — ED Provider Notes (Signed)
History     CSN: [id number]  Arrival date & time 04/02/12  0122   First MD Initiated Contact with Patient 04/02/12 0136      Chief Complaint  Patient presents with  . Fall    (Consider location/radiation/quality/duration/timing/severity/associated sxs/prior treatment) HPI History provided by EMS and nursing home. Per report, fell out of bed at nursing facility striking nose on the bedside table. No reported LOC. No vomiting. Patient has a history of dementia and is a very limited historian. Level V caveat applies. Patient denies any pain, does not answer any questions reliably. No other reported trauma. Past Medical History  Diagnosis Date  . 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 Esophageal reflux   . Osteoporosis, unspecified   . Type II or unspecified type diabetes mellitus with ophthalmic manifestations, not stated as uncontrolled(250.50)   . Chronic kidney disease, unspecified   . Other and unspecified hyperlipidemia   . Type II or unspecified type diabetes mellitus without mention of complication, not stated as uncontrolled   . Mitral valve insufficiency and aortic valve insufficiency   . Acute myocardial infarction, unspecified site, episode of care unspecified   . Atrial fibrillation   . Peptic ulcer, unspecified site, unspecified as acute or chronic, without mention of hemorrhage, perforation, or obstruction   . 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 Esophageal reflux   . Osteoporosis, unspecified   . Type II or unspecified type diabetes mellitus with ophthalmic manifestations, not stated as uncontrolled(250.50)   . Chronic kidney disease, unspecified   . Type II or unspecified type diabetes mellitus without mention of complication, not stated as uncontrolled   . Mitral valve insufficiency and aortic valve insufficiency   . Acute myocardial infarction, unspecified site, episode of care  unspecified   . Atrial fibrillation   . Hypertension   . Renal insufficiency   . Stasis dermatitis 2010    Dr. Terri Piedra    History reviewed. No pertinent past surgical history.  Family History  Problem Relation Age of Onset  . Hypertension Other   . Hypertension Mother   . Diabetes Mother   . Hypertension Father     History  Substance Use Topics  . Smoking status: Never Smoker   . Smokeless tobacco: Not on file  . Alcohol Use: No    OB History    Grav Para Term Preterm Abortions TAB SAB Ect Mult Living                  Review of Systems  Unable to perform ROS  Level V caveat applies as above. Allergies  Penicillins  Home Medications   Current Outpatient Rx  Name  Route  Sig  Dispense  Refill  . AMARYL 4 MG PO TABS      TAKE 1 TABLET BY MOUTH   EACH MORNING.   90 each   2   . ASPIRIN 81 MG PO TABS   Oral   Take 81 mg by mouth daily.           Marland Kitchen CALCIUM CARBONATE-VITAMIN D 600-200 MG-UNIT PO TABS   Oral   Take by mouth.           Marland Kitchen CLARITIN 10 MG PO TABS      TAKE 1 TABLET BY MOUTH ONCE DAILY.   90 each   3   . COLACE 100 MG PO CAPS      TAKE 1 CAPSULE BY  MOUTH TWICE DAILY.   60 each   5   . CYANOCOBALAMIN 1000 MCG/ML IJ SOLN   Intramuscular   Inject 1 mL (1,000 mcg total) into the muscle once.   1 mL   5   . DEXTROMETHORPHAN POLISTIREX ER 30 MG/5ML PO LQCR   Oral   Take 30 mg by mouth 2 (two) times daily.           Marland Kitchen DOCUSATE SODIUM 100 MG PO CAPS      TAKE 1 CAPSULE BY MOUTH  TWICE DAILY.   60 capsule   5   . ESOMEPRAZOLE MAGNESIUM 40 MG PO CPDR   Oral   Take 1 capsule (40 mg total) by mouth daily before breakfast.   30 capsule   5   . INSULIN DETEMIR 100 UNIT/ML [place] SOLN   Subcutaneous   Inject into the skin as directed. 22 units in the morning and 12 units in evening          . INSULIN PEN NEEDLE 29G X MISC   Intramuscular   Inject 1 each into the muscle as directed.   100 each   3   . LASIX 20 MG PO  TABS      TAKE 1 TABLET BY MOUTH   EACH MORNING.   30 each   5   . LOPERAMIDE HCL 2 MG PO TABS   Oral   Take 2 mg by mouth. Take 2 after 1st loose stool, then 1 after each loose stool (max 8 per 24 hours) as needed          . ICAPS MV PO TABS   Oral   Take 1 tablet by mouth 2 (two) times daily with a meal.   60 each   5   . NYSTATIN-TRIAMCINOLONE 100000-0.1 UNIT/GM-% EX CREA   Topical   Apply topically 2 (two) times daily.   30 g   1   . OLOPATADINE HCL 0.2 % OP SOLN   Ophthalmic   Apply 1 drop to eye as needed. For itchy eyes          . PRAVASTATIN SODIUM 40 MG PO TABS   Oral   Take 40 mg by mouth daily.           Marland Kitchen PROMETHAZINE HCL 25 MG PO TABS   Oral   Take 25 mg by mouth as needed.           . QC ASPIRIN LOW DOSE 81 MG PO TBEC      TAKE 1 TABLET BY MOUTH DAILY.   30 each   2   . REMINYL 12 MG PO TABS      TAKE (1) TABLET BY MOUTH TWICE DAILY.   60 each   5   . SENNOSIDES 25 MG PO TABS   Oral   Take 1 tablet (25 mg total) by mouth 2 (two) times daily as needed (for constipation).   60 each   4   . DERMAGRAN BC EX CREA   Topical   Apply topically as needed. Use qd-bid on buttock sores   98 g   0   . TRIAMCINOLONE ACETONIDE 0.1 % EX CREA   Topical   Apply 1 application topically 2 (two) times daily.           . TRIAMCINOLONE ACETONIDE 0.5 % EX CREA   Topical   Apply 1 application topically 2 (two) times daily as needed.           Marland Kitchen  BENEFIBER PO POWD      MIX 2 TEASPOONSFUL IN 4-8OUNCES OF WATER AND DRINKDAILY.   245 g   3     BP 121/45  Pulse 59  Temp 98.1 F (36.7 C) (Oral)  Resp 18  SpO2 98%  Physical Exam  Nursing note and vitals reviewed. Constitutional: She appears well-developed and well-nourished.  HENT:  Head: Normocephalic.       Swelling with tenderness over the bridge of the nose. No laceration. No septal hematoma or epistaxis. No midface instability.  Eyes: EOM are normal. Pupils are equal, round, and  reactive to light.  Neck: Normal range of motion. Neck supple.  Cardiovascular: Normal rate, normal heart sounds and intact distal pulses.   Pulmonary/Chest: Effort normal and breath sounds normal. No respiratory distress.  Abdominal: Soft. She exhibits no distension. There is no tenderness.  Musculoskeletal: Normal range of motion. She exhibits no edema.  Neurological:       Awake and alert. Follows limited commands.  Skin: Skin is warm and dry.    ED Course  Procedures (including critical care time)  Labs Reviewed - No data to display Ct Head Wo Contrast  04/02/2012  *RADIOLOGY REPORT*  Clinical Data:  Nasal injury, fall from bed.  CT HEAD WITHOUT CONTRAST CT CERVICAL SPINE WITHOUT CONTRAST  Technique:  Multidetector CT imaging of the head and cervical spine was performed following the standard protocol without intravenous contrast.  Multiplanar CT image reconstructions of the cervical spine were also generated.  Comparison:  CT head and cervical spine 10/08/2007  CT HEAD  Findings: Diffuse cerebral atrophy.  There are dilatation consistent with central atrophy.  Low attenuation changes in the deep white matter consistent with small vessel is effect or midline shift.  No gray-white matter junctions are distinct.  Basal cisterns are not effaced.  No evidence of acute intracranial hemorrhage.  Tortuous and ectatic basilar artery.  Vascular calcifications.  No depressed skull fractures.  Visualized mastoid air cells and paranasal sinuses are not opacified.  There are depressed nasal bone fractures with associated superficial hematoma in the soft tissues over the nose.  IMPRESSION: Nasal bone fractures.  No acute intracranial abnormality.  Chronic atrophy and small vessel ischemic changes.  CT CERVICAL SPINE  Findings: Normal alignment of the cervical vertebrae and facet joints.  Lateral masses of C1 appear symmetrical.  The odontoid process appears intact.  Degenerative changes throughout the  cervical spine with narrowed cervical interspaces and endplate hypertrophic changes.  Degenerative changes at C1-2 and throughout the facet joints.  No vertebral compression deformities.  No prevertebral soft tissue swelling.  No focal bone lesion or bone destruction.  Bone cortex and trabecular architecture appear intact.  Vascular calcifications.  IMPRESSION: Degenerative changes in the cervical spine.  No displaced fractures identified.   Original Report Authenticated By: Burman Nieves, M.D.    Ct Cervical Spine Wo Contrast  04/02/2012  *RADIOLOGY REPORT*  Clinical Data:  Nasal injury, fall from bed.  CT HEAD WITHOUT CONTRAST CT CERVICAL SPINE WITHOUT CONTRAST  Technique:  Multidetector CT imaging of the head and cervical spine was performed following the standard protocol without intravenous contrast.  Multiplanar CT image reconstructions of the cervical spine were also generated.  Comparison:  CT head and cervical spine 10/08/2007  CT HEAD  Findings: Diffuse cerebral atrophy.  There are dilatation consistent with central atrophy.  Low attenuation changes in the deep white matter consistent with small vessel is effect or midline shift.  No gray-white matter  junctions are distinct.  Basal cisterns are not effaced.  No evidence of acute intracranial hemorrhage.  Tortuous and ectatic basilar artery.  Vascular calcifications.  No depressed skull fractures.  Visualized mastoid air cells and paranasal sinuses are not opacified.  There are depressed nasal bone fractures with associated superficial hematoma in the soft tissues over the nose.  IMPRESSION: Nasal bone fractures.  No acute intracranial abnormality.  Chronic atrophy and small vessel ischemic changes.  CT CERVICAL SPINE  Findings: Normal alignment of the cervical vertebrae and facet joints.  Lateral masses of C1 appear symmetrical.  The odontoid process appears intact.  Degenerative changes throughout the cervical spine with narrowed cervical  interspaces and endplate hypertrophic changes.  Degenerative changes at C1-2 and throughout the facet joints.  No vertebral compression deformities.  No prevertebral soft tissue swelling.  No focal bone lesion or bone destruction.  Bone cortex and trabecular architecture appear intact.  Vascular calcifications.  IMPRESSION: Degenerative changes in the cervical spine.  No displaced fractures identified.   Original Report Authenticated By: Burman Nieves, M.D.    Ice. Tylenol.   Diagnosis fall, nasal fractures.  MDM   Elderly female with dementia and nursing home fell with nasal injury. CT scans reviewed and nasal fractures noted. No intracranial hemorrhage. Takes baby aspirin otherwise no blood thinners. Referral to maxillofacial when swelling has resolved. pain medications provided as needed. Stable for discharge back to nursing facility.  Vital signs and nursing notes reviewed.        Sunnie Nielsen, MD 04/02/12 [phone]

## 2012-04-02 NOTE — ED Notes (Signed)
As per EMS pt got feet tangled in sheets and fell out of bed hitting nose on bedside table. No LOC/N/V. SNF requested evaluation.VSS. Pt has no complaint of pain.

## 2012-04-02 NOTE — ED Notes (Signed)
Bed:WA11<BR> Expected date:<BR> Expected time:<BR> Means of arrival:<BR> Comments:<BR>

## 2012-07-03 ENCOUNTER — Emergency Department (HOSPITAL_COMMUNITY)
Admission: EM | Admit: 2012-07-03 | Discharge: 2012-07-03 | Disposition: A | Discharge status: Home / Self Care | Payer: Medicare Other | Attending: Emergency Medicine | Admitting: Emergency Medicine

## 2012-07-03 ENCOUNTER — Encounter (HOSPITAL_COMMUNITY): Payer: Self-pay | Admitting: Emergency Medicine

## 2012-07-03 DIAGNOSIS — Z8719 Personal history of other diseases of the digestive system: Secondary | ICD-10-CM | POA: Insufficient documentation

## 2012-07-03 DIAGNOSIS — E1169 Type 2 diabetes mellitus with other specified complication: Secondary | ICD-10-CM | POA: Insufficient documentation

## 2012-07-03 DIAGNOSIS — Z7982 Long term (current) use of aspirin: Secondary | ICD-10-CM | POA: Insufficient documentation

## 2012-07-03 DIAGNOSIS — K219 Gastro-esophageal reflux disease without esophagitis: Secondary | ICD-10-CM | POA: Insufficient documentation

## 2012-07-03 DIAGNOSIS — M810 Age-related osteoporosis without current pathological fracture: Secondary | ICD-10-CM | POA: Insufficient documentation

## 2012-07-03 DIAGNOSIS — E1139 Type 2 diabetes mellitus with other diabetic ophthalmic complication: Secondary | ICD-10-CM | POA: Insufficient documentation

## 2012-07-03 DIAGNOSIS — N189 Chronic kidney disease, unspecified: Secondary | ICD-10-CM | POA: Insufficient documentation

## 2012-07-03 DIAGNOSIS — Z872 Personal history of diseases of the skin and subcutaneous tissue: Secondary | ICD-10-CM | POA: Insufficient documentation

## 2012-07-03 DIAGNOSIS — I129 Hypertensive chronic kidney disease with stage 1 through stage 4 chronic kidney disease, or unspecified chronic kidney disease: Secondary | ICD-10-CM | POA: Insufficient documentation

## 2012-07-03 DIAGNOSIS — I4891 Unspecified atrial fibrillation: Secondary | ICD-10-CM | POA: Insufficient documentation

## 2012-07-03 DIAGNOSIS — I252 Old myocardial infarction: Secondary | ICD-10-CM | POA: Insufficient documentation

## 2012-07-03 DIAGNOSIS — Z794 Long term (current) use of insulin: Secondary | ICD-10-CM | POA: Insufficient documentation

## 2012-07-03 DIAGNOSIS — I080 Rheumatic disorders of both mitral and aortic valves: Secondary | ICD-10-CM | POA: Insufficient documentation

## 2012-07-03 DIAGNOSIS — Z79899 Other long term (current) drug therapy: Secondary | ICD-10-CM | POA: Insufficient documentation

## 2012-07-03 DIAGNOSIS — F0390 Unspecified dementia without behavioral disturbance: Secondary | ICD-10-CM | POA: Insufficient documentation

## 2012-07-03 LAB — BASIC METABOLIC PANEL
BUN: 22 mg/dL (ref 6–23)
Calcium: 8.9 mg/dL (ref 8.4–10.5)
GFR calc Af Amer: 62 mL/min — ABNORMAL LOW (ref >90)
GFR calc non Af Amer: 53 mL/min — ABNORMAL LOW (ref >90)
Glucose, Bld: 81 mg/dL (ref 70–99)
Potassium: 3.5 mEq/L (ref 3.5–5.1)
Sodium: 140 mEq/L (ref 135–145)

## 2012-07-03 LAB — GLUCOSE, CAPILLARY: Glucose-Capillary: 157 mg/dL — ABNORMAL HIGH (ref 70–99)

## 2012-07-03 NOTE — ED Notes (Signed)
Patient is alert and oriented x3.  She was given DC instructions and follow up visit instructions.  Patient gave verbal understanding. She was DC ambulatory under her own power to home.  V/S stable.  He was not showing any signs of distress on DC 

## 2012-07-03 NOTE — ED Notes (Addendum)
Pt here via ems for low blood sugar per ems the staff stated her blood sugar was 52 was given oj and sugar no blood sugar check at this time and Fire frighter had given her 1/2 oral gel blood sugar 83 pt is alert Pt is from [name]

## 2012-07-03 NOTE — ED Provider Notes (Signed)
History     CSN: [id number]  Arrival date & time 07/03/12  1714   First MD Initiated Contact with Patient 07/03/12 1734      Chief Complaint  Patient presents with  . Hypoglycemia    (Consider location/radiation/quality/duration/timing/severity/associated sxs/prior treatment) The history is provided by a relative and the patient. The history is limited by the condition of the patient.   patient here after having a hypoglycemia episode at the nursing home. She does have a history of dementia and no history as per her relative and the EMS report. Seen here recently for same. No recent vomiting or diarrhea. No fever or chills. Blood sugar was 52 and she was given orange juice and also sugar gel and sugar improved to 83. Patient transported here by EMS for further evaluation. Symptoms have improved and according to her relatives she is at her baseline  Past Medical History  Diagnosis Date  . 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 Esophageal reflux   . Osteoporosis, unspecified   . Type II or unspecified type diabetes mellitus with ophthalmic manifestations, not stated as uncontrolled(250.50)   . Chronic kidney disease, unspecified   . Other and unspecified hyperlipidemia   . Type II or unspecified type diabetes mellitus without mention of complication, not stated as uncontrolled   . Mitral valve insufficiency and aortic valve insufficiency   . Acute myocardial infarction, unspecified site, episode of care unspecified   . Atrial fibrillation   . Peptic ulcer, unspecified site, unspecified as acute or chronic, without mention of hemorrhage, perforation, or obstruction   . 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 Esophageal reflux   . Osteoporosis, unspecified   . Type II or unspecified type diabetes mellitus with ophthalmic manifestations, not stated as uncontrolled(250.50)   . Chronic kidney disease, unspecified    . Type II or unspecified type diabetes mellitus without mention of complication, not stated as uncontrolled   . Mitral valve insufficiency and aortic valve insufficiency   . Acute myocardial infarction, unspecified site, episode of care unspecified   . Atrial fibrillation   . Hypertension   . Renal insufficiency   . Stasis dermatitis 2010    Dr. Terri Piedra    History reviewed. No pertinent past surgical history.  Family History  Problem Relation Age of Onset  . Hypertension Other   . Hypertension Mother   . Diabetes Mother   . Hypertension Father     History  Substance Use Topics  . Smoking status: Never Smoker   . Smokeless tobacco: Not on file  . Alcohol Use: No    OB History   Grav Para Term Preterm Abortions TAB SAB Ect Mult Living                  Review of Systems  Unable to perform ROS   Allergies  Penicillins  Home Medications   Current Outpatient Rx  Name  Route  Sig  Dispense  Refill  . aspirin 81 MG tablet   Oral   Take 81 mg by mouth daily.           . cephALEXin (KEFLEX) 500 MG capsule   Oral   Take 500 mg by mouth 4 (four) times daily. x10 days         . cholecalciferol (VITAMIN D) 400 UNITS TABS   Oral   Take 400 Units by mouth every morning.         Marland Kitchen  docusate sodium (COLACE) 100 MG capsule   Oral   Take 100 mg by mouth daily. For constipation         . esomeprazole (NEXIUM) 40 MG capsule   Oral   Take 40 mg by mouth every morning.         . furosemide (LASIX) 20 MG tablet   Oral   Take 20 mg by mouth every morning.         . galantamine (RAZADYNE) 12 MG tablet   Oral   Take 12 mg by mouth 2 (two) times daily.         . Guar Gum (NUTRISOURCE FIBER PO)   Oral   Take 2 scoop by mouth daily. Mix 2 teaspoonsful in 4-8 oz of water         . insulin detemir (LEVEMIR) 100 UNIT/ML injection   Subcutaneous   Inject 16-40 Units into the skin 2 (two) times daily. Inject 40u sub-q in the am and inj 16u sub-q at supper          . insulin lispro (HUMALOG) 100 UNIT/ML injection   Subcutaneous   Inject 4-10 Units into the skin 2 (two) times daily. inj 12u sub-q at noon and 4u sub-q with dinner         . multivitamin-lutein (OCUVITE-LUTEIN) CAPS   Oral   Take 1 capsule by mouth 2 (two) times daily with a meal.         . pravastatin (PRAVACHOL) 40 MG tablet   Oral   Take 40 mg by mouth at bedtime.          . traMADol (ULTRAM) 50 MG tablet   Oral   Take 1 tablet (50 mg total) by mouth every 6 (six) hours as needed for pain.   15 tablet   0   . vitamin B-12 (CYANOCOBALAMIN) 1000 MCG tablet   Oral   Take 1,000 mcg by mouth every morning.         Marland Kitchen EXPIRED: Skin Protectants, Misc. (WHITE PETROLATUM-ZINC OXIDE) cream   Topical   Apply topically as needed. Use qd-bid on buttock sores   98 g   0     BP 117/54  Pulse 72  Temp(Src) 97.5 F (36.4 C) (Axillary)  Resp 16  SpO2 96%  Physical Exam  Nursing note and vitals reviewed. Constitutional: She appears well-developed and well-nourished.  Non-toxic appearance. No distress.  HENT:  Head: Normocephalic and atraumatic.  Eyes: Conjunctivae, EOM and lids are normal. Pupils are equal, round, and reactive to light.  Neck: Normal range of motion. Neck supple. No tracheal deviation present. No mass present.  Cardiovascular: Normal rate, regular rhythm and normal heart sounds.  Exam reveals no gallop.   No murmur heard. Pulmonary/Chest: Effort normal and breath sounds normal. No stridor. No respiratory distress. She has no decreased breath sounds. She has no wheezes. She has no rhonchi. She has no rales.  Abdominal: Soft. Normal appearance and bowel sounds are normal. She exhibits no distension. There is no tenderness. There is no rebound and no CVA tenderness.  Musculoskeletal: Normal range of motion. She exhibits no edema and no tenderness.  Neurological: She is alert. She has normal strength. No cranial nerve deficit or sensory deficit. GCS eye  subscore is 4. GCS verbal subscore is 5. GCS motor subscore is 6.  Skin: Skin is warm and dry. No abrasion and no rash noted.  Psychiatric: Her speech is normal. Her affect is blunt. She is slowed.  ED Course  Procedures (including critical care time)  Labs Reviewed  BASIC METABOLIC PANEL   No results found.   No diagnosis found.    MDM  Patient's blood sugar repeated multiple times here and has not been decreasing. She was given a meal to eat and her sugar appears to have stabilized. She will followup with the nursing home Dr.        Toy Baker, MD 07/03/12 2116

## 2012-07-03 NOTE — ED Notes (Signed)
CBG upon arrival to ED was 97 on ED Glucometer

## 2012-07-03 NOTE — ED Notes (Signed)
UXL:KG40<NU> Expected date:07/03/12<BR> Expected time: 5:10 PM<BR> Means of arrival:Ambulance<BR> Comments:<BR> Diabetic, hypogylcemia

## 2012-07-03 NOTE — ED Notes (Signed)
Pureed meal ordered.

## 2012-07-04 LAB — GLUCOSE, CAPILLARY: Glucose-Capillary: 97 mg/dL (ref 70–99)

## 2012-10-29 ENCOUNTER — Emergency Department (HOSPITAL_COMMUNITY): Payer: Medicare Other

## 2012-10-29 ENCOUNTER — Encounter (HOSPITAL_COMMUNITY): Payer: Self-pay | Admitting: *Deleted

## 2012-10-29 ENCOUNTER — Emergency Department (HOSPITAL_COMMUNITY)
Admission: EM | Admit: 2012-10-29 | Discharge: 2012-10-29 | Disposition: A | Discharge status: Home / Self Care | Payer: Medicare Other | Attending: Emergency Medicine | Admitting: Emergency Medicine

## 2012-10-29 DIAGNOSIS — S0990XA Unspecified injury of head, initial encounter: Secondary | ICD-10-CM

## 2012-10-29 DIAGNOSIS — I129 Hypertensive chronic kidney disease with stage 1 through stage 4 chronic kidney disease, or unspecified chronic kidney disease: Secondary | ICD-10-CM | POA: Insufficient documentation

## 2012-10-29 DIAGNOSIS — W1789XA Other fall from one level to another, initial encounter: Secondary | ICD-10-CM | POA: Insufficient documentation

## 2012-10-29 DIAGNOSIS — I080 Rheumatic disorders of both mitral and aortic valves: Secondary | ICD-10-CM | POA: Insufficient documentation

## 2012-10-29 DIAGNOSIS — Z79899 Other long term (current) drug therapy: Secondary | ICD-10-CM | POA: Insufficient documentation

## 2012-10-29 DIAGNOSIS — I4891 Unspecified atrial fibrillation: Secondary | ICD-10-CM | POA: Insufficient documentation

## 2012-10-29 DIAGNOSIS — S0003XA Contusion of scalp, initial encounter: Secondary | ICD-10-CM | POA: Insufficient documentation

## 2012-10-29 DIAGNOSIS — N189 Chronic kidney disease, unspecified: Secondary | ICD-10-CM | POA: Insufficient documentation

## 2012-10-29 DIAGNOSIS — K219 Gastro-esophageal reflux disease without esophagitis: Secondary | ICD-10-CM | POA: Insufficient documentation

## 2012-10-29 DIAGNOSIS — H579 Unspecified disorder of eye and adnexa: Secondary | ICD-10-CM | POA: Insufficient documentation

## 2012-10-29 DIAGNOSIS — M810 Age-related osteoporosis without current pathological fracture: Secondary | ICD-10-CM | POA: Insufficient documentation

## 2012-10-29 DIAGNOSIS — I252 Old myocardial infarction: Secondary | ICD-10-CM | POA: Insufficient documentation

## 2012-10-29 DIAGNOSIS — Y9389 Activity, other specified: Secondary | ICD-10-CM | POA: Insufficient documentation

## 2012-10-29 DIAGNOSIS — E1139 Type 2 diabetes mellitus with other diabetic ophthalmic complication: Secondary | ICD-10-CM | POA: Insufficient documentation

## 2012-10-29 DIAGNOSIS — Y92009 Unspecified place in unspecified non-institutional (private) residence as the place of occurrence of the external cause: Secondary | ICD-10-CM | POA: Insufficient documentation

## 2012-10-29 DIAGNOSIS — Z794 Long term (current) use of insulin: Secondary | ICD-10-CM | POA: Insufficient documentation

## 2012-10-29 DIAGNOSIS — F09 Unspecified mental disorder due to known physiological condition: Secondary | ICD-10-CM | POA: Insufficient documentation

## 2012-10-29 DIAGNOSIS — N390 Urinary tract infection, site not specified: Secondary | ICD-10-CM | POA: Insufficient documentation

## 2012-10-29 DIAGNOSIS — Z880 Allergy status to penicillin: Secondary | ICD-10-CM | POA: Insufficient documentation

## 2012-10-29 DIAGNOSIS — E785 Hyperlipidemia, unspecified: Secondary | ICD-10-CM | POA: Insufficient documentation

## 2012-10-29 DIAGNOSIS — Z7982 Long term (current) use of aspirin: Secondary | ICD-10-CM | POA: Insufficient documentation

## 2012-10-29 LAB — URINALYSIS, ROUTINE W REFLEX MICROSCOPIC
Bilirubin Urine: NEGATIVE
Hgb urine dipstick: NEGATIVE
Ketones, ur: NEGATIVE mg/dL
Nitrite: NEGATIVE
Specific Gravity, Urine: 1.007 (ref 1.005–1.030)
pH: 7.5 (ref 5.0–8.0)

## 2012-10-29 LAB — URINE MICROSCOPIC-ADD ON

## 2012-10-29 MED ORDER — CEPHALEXIN 250 MG PO CAPS: 250.0000 mg | ORAL_CAPSULE | Freq: Four times a day (QID) | ORAL | Status: DC

## 2012-10-29 NOTE — ED Notes (Signed)
[facility] called for transportation back to nrsg home 

## 2012-10-29 NOTE — ED Notes (Addendum)
Staff reports pt oriented to self only & is at her baseline. Pt denies any pain. No obvious other injuries noted. Good mvmt & strength equal to BUE, BLE, abrasions noted to bilateral knees. Large hematoma to posterior head, denies pain even with palpation. No ecchymosis, redness, bleeding noted.

## 2012-10-29 NOTE — ED Provider Notes (Signed)
History    CSN: [id number] Arrival date & time 10/29/12  4098  First MD Initiated Contact with Patient 10/29/12 [phone]     Chief Complaint  Patient presents with  . Fall  . Head Injury   (Consider location/radiation/quality/duration/timing/severity/associated sxs/prior Treatment) HPI Comments: [name] is a 79 y.o. Female who reportedly fell from a seated position onto the floor, striking her head. There is no reported loss of consciousness. She cannot give any history. She was transferred by EMS for evaluation, without any immobilization.   Level V caveat: Dementia  Patient is a 79 y.o. female presenting with fall and head injury. The history is provided by the nursing home.  Fall  Head Injury  Past Medical History  Diagnosis Date  . 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 Esophageal reflux   . Osteoporosis, unspecified   . Type II or unspecified type diabetes mellitus with ophthalmic manifestations, not stated as uncontrolled(250.50)   . Chronic kidney disease, unspecified   . Other and unspecified hyperlipidemia   . Type II or unspecified type diabetes mellitus without mention of complication, not stated as uncontrolled   . Mitral valve insufficiency and aortic valve insufficiency   . Acute myocardial infarction, unspecified site, episode of care unspecified   . Atrial fibrillation   . Peptic ulcer, unspecified site, unspecified as acute or chronic, without mention of hemorrhage, perforation, or obstruction   . 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 Esophageal reflux   . Osteoporosis, unspecified   . Type II or unspecified type diabetes mellitus with ophthalmic manifestations, not stated as uncontrolled(250.50)   . Chronic kidney disease, unspecified   . Type II or unspecified type diabetes mellitus without mention of complication, not stated as uncontrolled   . Mitral valve insufficiency and  aortic valve insufficiency   . Acute myocardial infarction, unspecified site, episode of care unspecified   . Atrial fibrillation   . Hypertension   . Renal insufficiency   . Stasis dermatitis 2010    Dr. Terri Piedra   History reviewed. No pertinent past surgical history. Family History  Problem Relation Age of Onset  . Hypertension Other   . Hypertension Mother   . Diabetes Mother   . Hypertension Father    History  Substance Use Topics  . Smoking status: Never Smoker   . Smokeless tobacco: Not on file  . Alcohol Use: No   OB History   Grav Para Term Preterm Abortions TAB SAB Ect Mult Living                 Review of Systems  Unable to perform ROS   Allergies  Penicillins  Home Medications   Current Outpatient Rx  Name  Route  Sig  Dispense  Refill  . aspirin 81 MG tablet   Oral   Take 81 mg by mouth daily.           . cholecalciferol (VITAMIN D) 400 UNITS TABS   Oral   Take 400 Units by mouth every morning.         . docusate sodium (COLACE) 100 MG capsule   Oral   Take 100 mg by mouth daily. For constipation         . esomeprazole (NEXIUM) 40 MG capsule   Oral   Take 40 mg by mouth every morning.         . furosemide (LASIX) 20 MG tablet  Oral   Take 20 mg by mouth every morning.         . galantamine (RAZADYNE) 12 MG tablet   Oral   Take 12 mg by mouth 2 (two) times daily.         . insulin detemir (LEVEMIR) 100 UNIT/ML injection   Subcutaneous   Inject 25-27 Units into the skin 2 (two) times daily. 27 units at breakfast 25 units at bedtime         . lisinopril (PRINIVIL,ZESTRIL) 2.5 MG tablet   Oral   Take 2.5 mg by mouth daily.         . multivitamin-lutein (OCUVITE-LUTEIN) CAPS   Oral   Take 1 capsule by mouth 2 (two) times daily with a meal.         . pravastatin (PRAVACHOL) 40 MG tablet   Oral   Take 40 mg by mouth at bedtime.          . vitamin B-12 (CYANOCOBALAMIN) 1000 MCG tablet   Oral   Take 1,000 mcg by  mouth every other day.          . cephALEXin (KEFLEX) 250 MG capsule   Oral   Take 1 capsule (250 mg total) by mouth 4 (four) times daily.   28 capsule   0   . EXPIRED: Skin Protectants, Misc. (WHITE PETROLATUM-ZINC OXIDE) cream   Topical   Apply topically as needed. Use qd-bid on buttock sores   98 g   0    BP 93/58  Pulse 78  Temp(Src) 97.7 F (36.5 C) (Oral)  Resp 17  SpO2 99% Physical Exam  Nursing note and vitals reviewed. Constitutional: She appears well-developed.  Frail, elderly  HENT:  Head: Normocephalic.  Large right parietal contusion without skin break, laceration or abrasion  Eyes: Conjunctivae and EOM are normal. Pupils are equal, round, and reactive to light.  Neck: Normal range of motion and phonation normal. Neck supple.  Cardiovascular: Normal rate, regular rhythm and intact distal pulses.   Pulmonary/Chest: Effort normal and breath sounds normal. She exhibits no tenderness.  Abdominal: Soft. She exhibits no distension. There is no tenderness. There is no guarding.  Musculoskeletal: Normal range of motion.  Neurological: She is alert. She has normal strength. No cranial nerve deficit. She exhibits normal muscle tone. Coordination normal.  Skin: Skin is warm and dry.  Psychiatric: Her behavior is normal.    ED Course  Procedures (including critical care time)     Labs Reviewed  URINALYSIS, ROUTINE W REFLEX MICROSCOPIC - Abnormal; Notable for the following:    Leukocytes, UA SMALL (*)    All other components within normal limits  URINE MICROSCOPIC-ADD ON - Abnormal; Notable for the following:    Bacteria, UA FEW (*)    All other components within normal limits  URINE CULTURE   Ct Head Wo Contrast  10/29/2012   *RADIOLOGY REPORT*  Clinical Data:  The fall from seated position.  No loss of consciousness.  Scalp hematoma.  CT HEAD WITHOUT CONTRAST CT CERVICAL SPINE WITHOUT CONTRAST  Technique:  Multidetector CT imaging of the head and cervical  spine was performed following the standard protocol without intravenous contrast.  Multiplanar CT image reconstructions of the cervical spine were also generated.  Comparison:  CT head and cervical spine 04/02/2012.  CT HEAD  Findings: A right to paramidline occipital and parietal scalp hematoma is present.  There is no underlying fracture.  No acute cortical infarct, hemorrhage, mass lesion is present.  Mild generalized atrophy and to a white matter disease is stable.  The ventricles are proportionate to the degree of atrophy.  No significant extra-axial fluid collection is present.  The paranasal sinuses and mastoid air cells are clear.  IMPRESSION:  1.  The right to parietal and occipital scalp hematoma without an underlying fracture. 2.  Stable atrophy and white matter disease.  CT CERVICAL SPINE  Findings: The cervical spine is imaged from the skull base through T2-3.  Chronic end plate changes at C4-5 are stable.  Chronic end plate changes and uncovertebral disease at C5-6, C6-7, and C7-T1 are not significantly changed.  Vertebral body heights and alignment maintained.  No acute fracture or traumatic subluxation is evident.  The soft tissues are remarkable for atherosclerotic calcifications.  The lung apices are clear.  IMPRESSION:  1.  Stable multilevel degenerative change. 2.  No acute fracture or traumatic subluxation. 3.  Atherosclerosis.   Original Report Authenticated By: Marin Roberts, M.D.   Ct Cervical Spine Wo Contrast  10/29/2012   *RADIOLOGY REPORT*  Clinical Data:  The fall from seated position.  No loss of consciousness.  Scalp hematoma.  CT HEAD WITHOUT CONTRAST CT CERVICAL SPINE WITHOUT CONTRAST  Technique:  Multidetector CT imaging of the head and cervical spine was performed following the standard protocol without intravenous contrast.  Multiplanar CT image reconstructions of the cervical spine were also generated.  Comparison:  CT head and cervical spine 04/02/2012.  CT HEAD   Findings: A right to paramidline occipital and parietal scalp hematoma is present.  There is no underlying fracture.  No acute cortical infarct, hemorrhage, mass lesion is present.  Mild generalized atrophy and to a white matter disease is stable.  The ventricles are proportionate to the degree of atrophy.  No significant extra-axial fluid collection is present.  The paranasal sinuses and mastoid air cells are clear.  IMPRESSION:  1.  The right to parietal and occipital scalp hematoma without an underlying fracture. 2.  Stable atrophy and white matter disease.  CT CERVICAL SPINE  Findings: The cervical spine is imaged from the skull base through T2-3.  Chronic end plate changes at C4-5 are stable.  Chronic end plate changes and uncovertebral disease at C5-6, C6-7, and C7-T1 are not significantly changed.  Vertebral body heights and alignment maintained.  No acute fracture or traumatic subluxation is evident.  The soft tissues are remarkable for atherosclerotic calcifications.  The lung apices are clear.  IMPRESSION:  1.  Stable multilevel degenerative change. 2.  No acute fracture or traumatic subluxation. 3.  Atherosclerosis.   Original Report Authenticated By: Marin Roberts, M.D.   1. Head injuries, initial encounter   2. Fall at home, initial encounter   3. UTI (urinary tract infection)     MDM  Fall without serious injury. Possible UTI contributing to fall. Doubt metabolic instability, serious bacterial infection or impending vascular collapse; the patient is stable for discharge.   Nursing Notes Reviewed/ Care Coordinated Applicable Imaging Reviewed Interpretation of Laboratory Data incorporated into ED treatment  Plan: Home Medications- Keflex; Home Treatments- rest; return here if the recommended treatment, does not improve the symptoms; Recommended follow up- PCP prn  Flint Melter, MD 10/29/12 [phone]

## 2012-10-29 NOTE — ED Notes (Signed)
From [facility] - staff reports pt fell from a seated position onto floor. No LOC. Positive hematoma to posterior head. Pt with no complaints.

## 2012-10-29 NOTE — ED Notes (Signed)
PTAR at bedside 

## 2012-10-29 NOTE — ED Notes (Signed)
Patient transported to CT 

## 2012-10-31 LAB — URINE CULTURE: Colony Count: >=100000

## 2012-11-01 NOTE — ED Notes (Signed)
Copy of labs results faxed to [facility].

## 2012-12-12 ENCOUNTER — Emergency Department (HOSPITAL_COMMUNITY)
Admission: EM | Admit: 2012-12-12 | Discharge: 2012-12-12 | Disposition: A | Discharge status: Home / Self Care | Payer: Medicare Other | Attending: Emergency Medicine | Admitting: Emergency Medicine

## 2012-12-12 ENCOUNTER — Emergency Department (HOSPITAL_COMMUNITY): Payer: Medicare Other

## 2012-12-12 ENCOUNTER — Encounter (HOSPITAL_COMMUNITY): Payer: Self-pay | Admitting: *Deleted

## 2012-12-12 DIAGNOSIS — Z7982 Long term (current) use of aspirin: Secondary | ICD-10-CM | POA: Insufficient documentation

## 2012-12-12 DIAGNOSIS — S0990XA Unspecified injury of head, initial encounter: Secondary | ICD-10-CM | POA: Insufficient documentation

## 2012-12-12 DIAGNOSIS — F29 Unspecified psychosis not due to a substance or known physiological condition: Secondary | ICD-10-CM | POA: Insufficient documentation

## 2012-12-12 DIAGNOSIS — K219 Gastro-esophageal reflux disease without esophagitis: Secondary | ICD-10-CM | POA: Insufficient documentation

## 2012-12-12 DIAGNOSIS — Z79899 Other long term (current) drug therapy: Secondary | ICD-10-CM | POA: Insufficient documentation

## 2012-12-12 DIAGNOSIS — Z880 Allergy status to penicillin: Secondary | ICD-10-CM | POA: Insufficient documentation

## 2012-12-12 DIAGNOSIS — Z8719 Personal history of other diseases of the digestive system: Secondary | ICD-10-CM | POA: Insufficient documentation

## 2012-12-12 DIAGNOSIS — E162 Hypoglycemia, unspecified: Secondary | ICD-10-CM

## 2012-12-12 DIAGNOSIS — Z8659 Personal history of other mental and behavioral disorders: Secondary | ICD-10-CM | POA: Insufficient documentation

## 2012-12-12 DIAGNOSIS — I252 Old myocardial infarction: Secondary | ICD-10-CM | POA: Insufficient documentation

## 2012-12-12 DIAGNOSIS — I129 Hypertensive chronic kidney disease with stage 1 through stage 4 chronic kidney disease, or unspecified chronic kidney disease: Secondary | ICD-10-CM | POA: Insufficient documentation

## 2012-12-12 DIAGNOSIS — Y939 Activity, unspecified: Secondary | ICD-10-CM | POA: Insufficient documentation

## 2012-12-12 DIAGNOSIS — E785 Hyperlipidemia, unspecified: Secondary | ICD-10-CM | POA: Insufficient documentation

## 2012-12-12 DIAGNOSIS — F0390 Unspecified dementia without behavioral disturbance: Secondary | ICD-10-CM | POA: Insufficient documentation

## 2012-12-12 DIAGNOSIS — E1169 Type 2 diabetes mellitus with other specified complication: Secondary | ICD-10-CM | POA: Insufficient documentation

## 2012-12-12 DIAGNOSIS — R410 Disorientation, unspecified: Secondary | ICD-10-CM

## 2012-12-12 DIAGNOSIS — I4891 Unspecified atrial fibrillation: Secondary | ICD-10-CM | POA: Insufficient documentation

## 2012-12-12 DIAGNOSIS — N189 Chronic kidney disease, unspecified: Secondary | ICD-10-CM | POA: Insufficient documentation

## 2012-12-12 DIAGNOSIS — Z8739 Personal history of other diseases of the musculoskeletal system and connective tissue: Secondary | ICD-10-CM | POA: Insufficient documentation

## 2012-12-12 DIAGNOSIS — Z87448 Personal history of other diseases of urinary system: Secondary | ICD-10-CM | POA: Insufficient documentation

## 2012-12-12 DIAGNOSIS — Z794 Long term (current) use of insulin: Secondary | ICD-10-CM | POA: Insufficient documentation

## 2012-12-12 DIAGNOSIS — W19XXXA Unspecified fall, initial encounter: Secondary | ICD-10-CM | POA: Insufficient documentation

## 2012-12-12 DIAGNOSIS — Y92009 Unspecified place in unspecified non-institutional (private) residence as the place of occurrence of the external cause: Secondary | ICD-10-CM | POA: Insufficient documentation

## 2012-12-12 DIAGNOSIS — Z8679 Personal history of other diseases of the circulatory system: Secondary | ICD-10-CM | POA: Insufficient documentation

## 2012-12-12 LAB — URINALYSIS, ROUTINE W REFLEX MICROSCOPIC
Glucose, UA: NEGATIVE mg/dL
Ketones, ur: NEGATIVE mg/dL
Leukocytes, UA: NEGATIVE
Nitrite: NEGATIVE
Specific Gravity, Urine: 1.009 (ref 1.005–1.030)
pH: 6.5 (ref 5.0–8.0)

## 2012-12-12 LAB — CBC
HCT: 40.6 % (ref 36.0–46.0)
Hemoglobin: 14.2 g/dL (ref 12.0–15.0)
MCH: 31.5 pg (ref 26.0–34.0)
MCHC: 35 g/dL (ref 30.0–36.0)
MCV: 90 fL (ref 78.0–100.0)
RDW: 13.2 % (ref 11.5–15.5)

## 2012-12-12 LAB — COMPREHENSIVE METABOLIC PANEL
Albumin: 3.6 g/dL (ref 3.5–5.2)
Alkaline Phosphatase: 125 U/L — ABNORMAL HIGH (ref 39–117)
BUN: 29 mg/dL — ABNORMAL HIGH (ref 6–23)
Calcium: 9.1 mg/dL (ref 8.4–10.5)
Creatinine, Ser: 0.92 mg/dL (ref 0.50–1.10)
GFR calc Af Amer: 60 mL/min — ABNORMAL LOW (ref >90)
Glucose, Bld: 133 mg/dL — ABNORMAL HIGH (ref 70–99)
Total Protein: 6.9 g/dL (ref 6.0–8.3)

## 2012-12-12 MED ORDER — SODIUM CHLORIDE 0.9 % IV BOLUS (SEPSIS)
500.0000 mL | Freq: Once | INTRAVENOUS | Status: AC
  Administered 2012-12-12: 500 mL via INTRAVENOUS

## 2012-12-12 NOTE — ED Notes (Signed)
In and out cath

## 2012-12-12 NOTE — ED Provider Notes (Signed)
CSN: [id number]     Arrival date & time 12/12/12  0150 History     First MD Initiated Contact with Patient 12/12/12 [phone]     Chief Complaint  Patient presents with  . Altered Mental Status   (Consider location/radiation/quality/duration/timing/severity/associated sxs/prior Treatment) HPI Comments: 79 yo female with DM, dementia, hypoglycemia hx presents with confusion, low glu and head injury.  Pt was found on the ground altered with low glucose, treated in the field. Unwitnessed fall. Unresponsive for EMS, worse than her baseline per report.  Nothing improves sx.  Unable to obtain details due to dementia/ confusion.   Patient is a 79 y.o. female presenting with altered mental status. The history is provided by the patient, a relative and the nursing home.  Altered Mental Status Presenting symptoms: confusion     Past Medical History  Diagnosis Date  . 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 Esophageal reflux   . Osteoporosis, unspecified   . Type II or unspecified type diabetes mellitus with ophthalmic manifestations, not stated as uncontrolled(250.50)   . Chronic kidney disease, unspecified   . Other and unspecified hyperlipidemia   . Type II or unspecified type diabetes mellitus without mention of complication, not stated as uncontrolled   . Mitral valve insufficiency and aortic valve insufficiency   . Acute myocardial infarction, unspecified site, episode of care unspecified   . Atrial fibrillation   . Peptic ulcer, unspecified site, unspecified as acute or chronic, without mention of hemorrhage, perforation, or obstruction   . 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 Esophageal reflux   . Osteoporosis, unspecified   . Type II or unspecified type diabetes mellitus with ophthalmic manifestations, not stated as uncontrolled(250.50)   . Chronic kidney disease, unspecified   . Type II or unspecified type  diabetes mellitus without mention of complication, not stated as uncontrolled   . Mitral valve insufficiency and aortic valve insufficiency   . Acute myocardial infarction, unspecified site, episode of care unspecified   . Atrial fibrillation   . Hypertension   . Renal insufficiency   . Stasis dermatitis 2010    Dr. Terri Piedra   No past surgical history on file. Family History  Problem Relation Age of Onset  . Hypertension Other   . Hypertension Mother   . Diabetes Mother   . Hypertension Father    History  Substance Use Topics  . Smoking status: Never Smoker   . Smokeless tobacco: Not on file  . Alcohol Use: No   OB History   Grav Para Term Preterm Abortions TAB SAB Ect Mult Living                 Review of Systems  Unable to perform ROS: Dementia  Psychiatric/Behavioral: Positive for confusion.    Allergies  Penicillins  Home Medications   Current Outpatient Rx  Name  Route  Sig  Dispense  Refill  . aspirin 81 MG tablet   Oral   Take 81 mg by mouth daily.           . cholecalciferol (VITAMIN D) 400 UNITS TABS   Oral   Take 400 Units by mouth every morning.         . docusate sodium (COLACE) 100 MG capsule   Oral   Take 100 mg by mouth daily. For constipation         . esomeprazole (NEXIUM) 40 MG capsule   Oral  Take 40 mg by mouth every morning.         . furosemide (LASIX) 20 MG tablet   Oral   Take 20 mg by mouth every morning.         . galantamine (RAZADYNE) 12 MG tablet   Oral   Take 12 mg by mouth 2 (two) times daily.         . insulin detemir (LEVEMIR) 100 UNIT/ML injection   Subcutaneous   Inject 25-27 Units into the skin 2 (two) times daily. 27 units at breakfast 25 units at bedtime         . insulin lispro (HUMALOG KWIKPEN) 100 UNIT/ML SOPN   Subcutaneous   Inject 5 Units into the skin 3 (three) times daily with meals. HOLD IF BS <180 or does not eat         . lisinopril (PRINIVIL,ZESTRIL) 2.5 MG tablet   Oral   Take  2.5 mg by mouth daily.         . multivitamin-lutein (OCUVITE-LUTEIN) CAPS   Oral   Take 1 capsule by mouth 2 (two) times daily with a meal.         . pravastatin (PRAVACHOL) 20 MG tablet   Oral   Take 20 mg by mouth at bedtime.         . senna (SENOKOT) 8.6 MG TABS tablet   Oral   Take 2 tablets by mouth 2 (two) times daily as needed (for constipation).         . vitamin B-12 (CYANOCOBALAMIN) 1000 MCG tablet   Oral   Take 1,000 mcg by mouth every other day.          . zinc oxide 20 % ointment   Topical   Apply 1 application topically 2 (two) times daily as needed for dry skin.          BP 140/63  Pulse 80  Resp 19  SpO2 98% Physical Exam  Nursing note and vitals reviewed. Constitutional: She is oriented to person, place, and time. She appears well-developed and well-nourished.  HENT:  Head: Normocephalic.  Mild dry mm  Eyes: Conjunctivae are normal. Right eye exhibits no discharge. Left eye exhibits no discharge.  Neck: Normal range of motion. Neck supple. No tracheal deviation present.  Cardiovascular: Normal rate and regular rhythm.   Pulmonary/Chest: Effort normal and breath sounds normal.  Abdominal: Soft. She exhibits no distension. There is no tenderness. There is no guarding.  Musculoskeletal: She exhibits no edema.  Full rom of hips,shoulder, elbows and neck without pain, no step off.  Neurological: She is alert and oriented to person, place, and time. GCS eye subscore is 4. GCS verbal subscore is 4. GCS motor subscore is 5.  Moves all ext equal bilateral Mild agitation, dementia Minimal verbal Difficulty following commands  Skin: Skin is warm.  1 cm lac left mandible, no gaping, bleeding controlled  Psychiatric: She has a normal mood and affect.  Mild agitation, dementia    ED Course   Procedures (including critical care time)  Labs Reviewed  GLUCOSE, CAPILLARY - Abnormal; Notable for the following:    Glucose-Capillary 186 (*)    All  other components within normal limits  CBC - Abnormal; Notable for the following:    WBC 11.8 (*)    All other components within normal limits  COMPREHENSIVE METABOLIC PANEL - Abnormal; Notable for the following:    Glucose, Bld 133 (*)    BUN 29 (*)  Alkaline Phosphatase 125 (*)    GFR calc non Af Amer 52 (*)    GFR calc Af Amer 60 (*)    All other components within normal limits  URINALYSIS, ROUTINE W REFLEX MICROSCOPIC   Dg Pelvis 1-2 Views  12/12/2012   *RADIOLOGY REPORT*  Clinical Data: Altered mental status  PELVIS - 1-2 VIEW  Comparison: None.  Findings: Osteopenia.  Pubic symphysis DJD.  Sacrum obscured by overlying bowel.  No displaced fracture identified within limitations of a single view. Atherosclerotic vascular calcifications.  Recommend dedicated views if there is a focal area of concern.  IMPRESSION: No acute osseous finding identified within limitations of a single view.   Original Report Authenticated By: Jearld Lesch, M.D.   Ct Head Wo Contrast (only If Suspected Head Trauma And/or Pt Is On Anticoagulant)  12/12/2012   *RADIOLOGY REPORT*  Clinical Data:  Unresponsive  CT HEAD WITHOUT CONTRAST CT CERVICAL SPINE WITHOUT CONTRAST  Technique:  Multidetector CT imaging of the head and cervical spine was performed following the standard protocol without intravenous contrast.  Multiplanar CT image reconstructions of the cervical spine were also generated.  Comparison:  10/29/2012  CT HEAD  Findings: Prominence of the sulci, cisterns, and ventricles, in keeping with volume loss. There are subcortical and periventricular white matter hypodensities, a nonspecific finding most often seen with chronic microangiopathic changes.  There is no evidence for acute hemorrhage, overt hydrocephalus, mass lesion, or abnormal extra-axial fluid collection.  No definite CT evidence for acute cortical based (large artery) infarction.  No displaced calvarial fracture.  IMPRESSION: Mild volume loss and  white matter changes.  No CT evidence of acute intracranial abnormality.  CT CERVICAL SPINE  Findings: Lung apices clear.  Paravertebral soft tissues within normal limits.  Scattered atherosclerotic vascular calcifications. Maintained craniocervical relationship.  No dens fracture. Multilevel degenerative changes.  Maintained vertebral body height and alignment.  IMPRESSION: Multilevel degenerative changes.  No acute osseous finding of the cervical spine.   Original Report Authenticated By: Jearld Lesch, M.D.   Ct Cervical Spine Wo Contrast  12/12/2012   *RADIOLOGY REPORT*  Clinical Data:  Unresponsive  CT HEAD WITHOUT CONTRAST CT CERVICAL SPINE WITHOUT CONTRAST  Technique:  Multidetector CT imaging of the head and cervical spine was performed following the standard protocol without intravenous contrast.  Multiplanar CT image reconstructions of the cervical spine were also generated.  Comparison:  10/29/2012  CT HEAD  Findings: Prominence of the sulci, cisterns, and ventricles, in keeping with volume loss. There are subcortical and periventricular white matter hypodensities, a nonspecific finding most often seen with chronic microangiopathic changes.  There is no evidence for acute hemorrhage, overt hydrocephalus, mass lesion, or abnormal extra-axial fluid collection.  No definite CT evidence for acute cortical based (large artery) infarction.  No displaced calvarial fracture.  IMPRESSION: Mild volume loss and white matter changes.  No CT evidence of acute intracranial abnormality.  CT CERVICAL SPINE  Findings: Lung apices clear.  Paravertebral soft tissues within normal limits.  Scattered atherosclerotic vascular calcifications. Maintained craniocervical relationship.  No dens fracture. Multilevel degenerative changes.  Maintained vertebral body height and alignment.  IMPRESSION: Multilevel degenerative changes.  No acute osseous finding of the cervical spine.   Original Report Authenticated By: Jearld Lesch, M.D.   1. Hypoglycemia   2. Fall at nursing home, initial encounter   3. Head injury, acute, initial encounter   4. Confusion   5. Dementia     MDM  Pt improved  after glucose and with painful stimuli.  Initially no family in ED.  AMS work up, mild trauma on exam to face.  Fluid bolus in ED.   Mild agitation.  Rechecked, no acute changes, family arrived, states pt is currently at baseline mental status. Work up no acute findings except low glu in the field to cause fall.  Pt alert, sitting up.  Family comfortable with close fup with pcp. Discussed lowering insulin dosing and glu checks. DC  Enid Skeens, MD 12/12/12 [phone]

## 2012-12-12 NOTE — ED Notes (Signed)
Pt arrived by [facility] from [facility] nursing facility by [address]. 18 ga left hand. 250 cc D10 administered by EMS prior to arrival. EMS found pt on scene unresponsive. PT upon arrival pulling on KED straps, and removing NRB mask.

## 2013-04-10 ENCOUNTER — Emergency Department (HOSPITAL_COMMUNITY)
Admission: EM | Admit: 2013-04-10 | Discharge: 2013-04-11 | Disposition: A | Discharge status: Home / Self Care | Payer: Medicare Other | Attending: Emergency Medicine | Admitting: Emergency Medicine

## 2013-04-10 ENCOUNTER — Emergency Department (HOSPITAL_COMMUNITY): Payer: Medicare Other

## 2013-04-10 ENCOUNTER — Encounter (HOSPITAL_COMMUNITY): Payer: Self-pay | Admitting: Emergency Medicine

## 2013-04-10 DIAGNOSIS — Z872 Personal history of diseases of the skin and subcutaneous tissue: Secondary | ICD-10-CM | POA: Insufficient documentation

## 2013-04-10 DIAGNOSIS — I129 Hypertensive chronic kidney disease with stage 1 through stage 4 chronic kidney disease, or unspecified chronic kidney disease: Secondary | ICD-10-CM | POA: Insufficient documentation

## 2013-04-10 DIAGNOSIS — H5789 Other specified disorders of eye and adnexa: Secondary | ICD-10-CM | POA: Insufficient documentation

## 2013-04-10 DIAGNOSIS — Z794 Long term (current) use of insulin: Secondary | ICD-10-CM | POA: Insufficient documentation

## 2013-04-10 DIAGNOSIS — Z7982 Long term (current) use of aspirin: Secondary | ICD-10-CM | POA: Insufficient documentation

## 2013-04-10 DIAGNOSIS — K219 Gastro-esophageal reflux disease without esophagitis: Secondary | ICD-10-CM | POA: Insufficient documentation

## 2013-04-10 DIAGNOSIS — W19XXXA Unspecified fall, initial encounter: Secondary | ICD-10-CM | POA: Insufficient documentation

## 2013-04-10 DIAGNOSIS — I252 Old myocardial infarction: Secondary | ICD-10-CM | POA: Insufficient documentation

## 2013-04-10 DIAGNOSIS — S8990XA Unspecified injury of unspecified lower leg, initial encounter: Secondary | ICD-10-CM | POA: Insufficient documentation

## 2013-04-10 DIAGNOSIS — Z79899 Other long term (current) drug therapy: Secondary | ICD-10-CM | POA: Insufficient documentation

## 2013-04-10 DIAGNOSIS — Y9210 Unspecified residential institution as the place of occurrence of the external cause: Secondary | ICD-10-CM | POA: Insufficient documentation

## 2013-04-10 DIAGNOSIS — F0390 Unspecified dementia without behavioral disturbance: Secondary | ICD-10-CM | POA: Insufficient documentation

## 2013-04-10 DIAGNOSIS — Z880 Allergy status to penicillin: Secondary | ICD-10-CM | POA: Insufficient documentation

## 2013-04-10 DIAGNOSIS — Z8711 Personal history of peptic ulcer disease: Secondary | ICD-10-CM | POA: Insufficient documentation

## 2013-04-10 DIAGNOSIS — I080 Rheumatic disorders of both mitral and aortic valves: Secondary | ICD-10-CM | POA: Insufficient documentation

## 2013-04-10 DIAGNOSIS — M810 Age-related osteoporosis without current pathological fracture: Secondary | ICD-10-CM | POA: Insufficient documentation

## 2013-04-10 DIAGNOSIS — I4891 Unspecified atrial fibrillation: Secondary | ICD-10-CM | POA: Insufficient documentation

## 2013-04-10 DIAGNOSIS — N189 Chronic kidney disease, unspecified: Secondary | ICD-10-CM | POA: Insufficient documentation

## 2013-04-10 DIAGNOSIS — Y939 Activity, unspecified: Secondary | ICD-10-CM | POA: Insufficient documentation

## 2013-04-10 NOTE — ED Notes (Signed)
Patient transported to CT 

## 2013-04-10 NOTE — ED Notes (Signed)
Patient had an unwitnessed fall at the nursing. She was found lying supine on the floor by nursing staff.

## 2013-04-10 NOTE — ED Notes (Signed)
Bed: WA20 Expected date:  Expected time:  Means of arrival:  Comments: EMS 79yo Fall

## 2013-04-10 NOTE — Progress Notes (Signed)
Unable to obtain all pertinent information due to patient's cognition.

## 2013-04-10 NOTE — ED Provider Notes (Signed)
CSN: [id number]     Arrival date & time 04/10/13  2231 History   First MD Initiated Contact with Patient 04/10/13 2302     Chief Complaint  Patient presents with  . Fall   (Consider location/radiation/quality/duration/timing/severity/associated sxs/prior Treatment) HPI 79 year old female presents to emergency room via EMS from her nursing facility, where she was found on the floor.  Patient has history of dementia, she is nonverbal at this time.  No reported injury or bruising noted.  She has history of previous falls.  I contacted the nursing facility, [facility].  Current nurse was not on duty when patient was found on the floor.  Patient noted to have swollen right knee, nurse, cannot tell me if this is new or old.  Past Medical History  Diagnosis Date  . 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 Esophageal reflux   . Osteoporosis, unspecified   . Type II or unspecified type diabetes mellitus with ophthalmic manifestations, not stated as uncontrolled(250.50)   . Chronic kidney disease, unspecified   . Other and unspecified hyperlipidemia   . Type II or unspecified type diabetes mellitus without mention of complication, not stated as uncontrolled   . Mitral valve insufficiency and aortic valve insufficiency   . Acute myocardial infarction, unspecified site, episode of care unspecified   . Atrial fibrillation   . Peptic ulcer, unspecified site, unspecified as acute or chronic, without mention of hemorrhage, perforation, or obstruction   . 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 Esophageal reflux   . Osteoporosis, unspecified   . Type II or unspecified type diabetes mellitus with ophthalmic manifestations, not stated as uncontrolled(250.50)   . Chronic kidney disease, unspecified   . Type II or unspecified type diabetes mellitus without mention of complication, not stated as uncontrolled   .  Mitral valve insufficiency and aortic valve insufficiency   . Acute myocardial infarction, unspecified site, episode of care unspecified   . Atrial fibrillation   . Hypertension   . Renal insufficiency   . Stasis dermatitis 2010    Dr. Terri Piedra   History reviewed. No pertinent past surgical history. Family History  Problem Relation Age of Onset  . Hypertension Other   . Hypertension Mother   . Diabetes Mother   . Hypertension Father    History  Substance Use Topics  . Smoking status: Never Smoker   . Smokeless tobacco: Not on file  . Alcohol Use: No   OB History   Grav Para Term Preterm Abortions TAB SAB Ect Mult Living                 Review of Systems  Unable to perform ROS: Dementia    Allergies  Penicillins  Home Medications   Current Outpatient Rx  Name  Route  Sig  Dispense  Refill  . aspirin 81 MG tablet   Oral   Take 81 mg by mouth daily.           . cholecalciferol (VITAMIN D) 400 UNITS TABS   Oral   Take 400 Units by mouth every morning.         . docusate sodium (COLACE) 100 MG capsule   Oral   Take 100 mg by mouth daily. For constipation         . esomeprazole (NEXIUM) 40 MG capsule   Oral   Take 40 mg by mouth every morning.         Marland Kitchen  furosemide (LASIX) 20 MG tablet   Oral   Take 20 mg by mouth.         . galantamine (RAZADYNE) 12 MG tablet   Oral   Take 12 mg by mouth 2 (two) times daily.         . insulin detemir (LEVEMIR) 100 UNIT/ML injection   Subcutaneous   Inject 30 Units into the skin daily. In morning         . insulin lispro (HUMALOG KWIKPEN) 100 UNIT/ML SOPN   Subcutaneous   Inject 3 Units into the skin 3 (three) times daily with meals. HOLD IF BS <200 or does not eat         . lisinopril (PRINIVIL,ZESTRIL) 2.5 MG tablet   Oral   Take 2.5 mg by mouth daily.         . multivitamin-lutein (OCUVITE-LUTEIN) CAPS   Oral   Take 1 capsule by mouth 2 (two) times daily with a meal.         . nystatin cream  (MYCOSTATIN)   Topical   Apply 1 application topically 2 (two) times daily. Both breast         . pravastatin (PRAVACHOL) 20 MG tablet   Oral   Take 20 mg by mouth at bedtime.         . vitamin B-12 (CYANOCOBALAMIN) 1000 MCG tablet   Oral   Take 1,000 mcg by mouth every other day.          . senna (SENOKOT) 8.6 MG TABS tablet   Oral   Take 2 tablets by mouth 2 (two) times daily as needed (for constipation).         . zinc oxide 20 % ointment   Topical   Apply 1 application topically 2 (two) times daily as needed for dry skin.          BP 155/55  Pulse 72  Temp(Src) 98.7 F (37.1 C) (Oral)  Resp 18  SpO2 99% Physical Exam  Nursing note and vitals reviewed. Constitutional: No distress.  Elderly frail female, presented boarded and collared.  Patient does not answer questions or follow commands  HENT:  Head: Normocephalic and atraumatic.  Right Ear: External ear normal.  Left Ear: External ear normal.  Nose: Nose normal.  Mouth/Throat: Oropharynx is clear and moist.  Eyes: Conjunctivae and EOM are normal. Pupils are equal, round, and reactive to light. Left eye exhibits discharge.  Neck:  Posterior cervical spine is nontender, no step-off or crepitus.  Patient is in c-collar  Cardiovascular: Normal rate, regular rhythm, normal heart sounds and intact distal pulses.  Exam reveals no gallop and no friction rub.   No murmur heard. Pulmonary/Chest: Effort normal and breath sounds normal. No respiratory distress. She has no wheezes. She has no rales. She exhibits no tenderness.  Abdominal: Soft. Bowel sounds are normal. She exhibits no distension and no mass. There is no tenderness. There is no rebound and no guarding.  Musculoskeletal:  Patient has swelling to right knee, which appears chronic in nature.  It is nonpitting.  She resists flexion and extension of the hip, knee and ankle on the right side.  Rest of the extremities show full passive range of motion   Skin:  Skin is warm and dry. No rash noted. No erythema. No pallor.    ED Course  Procedures (including critical care time) Labs Review Labs Reviewed  BASIC METABOLIC PANEL - Abnormal; Notable for the following:    Glucose,  Bld 265 (*)    BUN 31 (*)    GFR calc non Af Amer 50 (*)    GFR calc Af Amer 58 (*)    All other components within normal limits  URINALYSIS, ROUTINE W REFLEX MICROSCOPIC - Abnormal; Notable for the following:    Glucose, UA 250 (*)    All other components within normal limits  GLUCOSE, CAPILLARY - Abnormal; Notable for the following:    Glucose-Capillary 249 (*)    All other components within normal limits  CBC WITH DIFFERENTIAL   Imaging Review Dg Hip Complete Right  04/11/2013   CLINICAL DATA:  Fall.  Right hip pain.  EXAM: RIGHT HIP - COMPLETE 2+ VIEW  COMPARISON:  07/20/2010.  FINDINGS: Right hip appears located. Mineralization is within normal limits. Right obturator rings are intact. No femoral fracture. Right SI joint degenerative disease.  IMPRESSION: No acute abnormality.   Electronically Signed   By: Andreas Newport M.D.   On: 04/11/2013 00:11   Dg Knee 2 Views Right  04/11/2013   CLINICAL DATA:  Status post fall; right knee pain.  EXAM: RIGHT KNEE - 1-2 VIEW  COMPARISON:  None.  FINDINGS: There is no evidence of fracture or dislocation. The joint spaces are preserved. No significant degenerative change is seen; the patellofemoral joint is grossly unremarkable in appearance.  No significant joint effusion is seen. The visualized soft tissues are normal in appearance.  IMPRESSION: No evidence of fracture or dislocation. Evaluation mildly suboptimal due to the patient's habitus.   Electronically Signed   By: Roanna Raider M.D.   On: 04/11/2013 00:13   Ct Head Wo Contrast  04/11/2013   CLINICAL DATA:  Unwitnessed fall; found on floor. Concern for head and cervical spine injury.  EXAM: CT HEAD WITHOUT CONTRAST  CT CERVICAL SPINE WITHOUT CONTRAST  TECHNIQUE:  Multidetector CT imaging of the head and cervical spine was performed following the standard protocol without intravenous contrast. Multiplanar CT image reconstructions of the cervical spine were also generated.  COMPARISON:  CT of the head and cervical spine performed 12/12/2012  FINDINGS: CT HEAD FINDINGS  There is no evidence of acute infarction, mass lesion, or intra- or extra-axial hemorrhage on CT.  Prominence of the ventricles and sulci reflects mild to moderate age-appropriate cortical volume loss. Scattered periventricular and subcortical white matter change likely reflects small vessel ischemic microangiopathy. Cerebellar atrophy is noted.  The brainstem and fourth ventricle are within normal limits. The basal ganglia are unremarkable in appearance. The cerebral hemispheres demonstrate grossly normal gray-white differentiation. No mass effect or midline shift is seen.  There is no evidence of fracture; visualized osseous structures are unremarkable in appearance. The visualized portions of the orbits are within normal limits. The paranasal sinuses and mastoid air cells are well-aerated. No significant soft tissue abnormalities are seen.  CT CERVICAL SPINE FINDINGS  There is no evidence of fracture or subluxation. Vertebral bodies demonstrate normal height and alignment. Multilevel disc space narrowing is noted along the lower cervical spine, with mild endplate degenerative changes at multiple levels. Prevertebral soft tissues are within normal limits. There is incomplete fusion of the posterior arch of C1.  The thyroid gland is unremarkable in appearance. Mild interstitial prominence is noted at the lung apices; this may be transient in nature. No significant soft tissue abnormalities are seen.  IMPRESSION: 1. No evidence of traumatic intracranial injury or fracture. 2. No evidence of fracture or subluxation along the cervical spine. 3. Mild to moderate age-appropriate cortical  volume loss and scattered  small vessel ischemic microangiopathy. 4. Degenerative change along the cervical spine. 5. Mild interstitial prominence at the lung apices; this may be transient in nature.   Electronically Signed   By: Roanna Raider M.D.   On: 04/11/2013 00:21   Ct Cervical Spine Wo Contrast  04/11/2013   CLINICAL DATA:  Unwitnessed fall; found on floor. Concern for head and cervical spine injury.  EXAM: CT HEAD WITHOUT CONTRAST  CT CERVICAL SPINE WITHOUT CONTRAST  TECHNIQUE: Multidetector CT imaging of the head and cervical spine was performed following the standard protocol without intravenous contrast. Multiplanar CT image reconstructions of the cervical spine were also generated.  COMPARISON:  CT of the head and cervical spine performed 12/12/2012  FINDINGS: CT HEAD FINDINGS  There is no evidence of acute infarction, mass lesion, or intra- or extra-axial hemorrhage on CT.  Prominence of the ventricles and sulci reflects mild to moderate age-appropriate cortical volume loss. Scattered periventricular and subcortical white matter change likely reflects small vessel ischemic microangiopathy. Cerebellar atrophy is noted.  The brainstem and fourth ventricle are within normal limits. The basal ganglia are unremarkable in appearance. The cerebral hemispheres demonstrate grossly normal gray-white differentiation. No mass effect or midline shift is seen.  There is no evidence of fracture; visualized osseous structures are unremarkable in appearance. The visualized portions of the orbits are within normal limits. The paranasal sinuses and mastoid air cells are well-aerated. No significant soft tissue abnormalities are seen.  CT CERVICAL SPINE FINDINGS  There is no evidence of fracture or subluxation. Vertebral bodies demonstrate normal height and alignment. Multilevel disc space narrowing is noted along the lower cervical spine, with mild endplate degenerative changes at multiple levels. Prevertebral soft tissues are within normal  limits. There is incomplete fusion of the posterior arch of C1.  The thyroid gland is unremarkable in appearance. Mild interstitial prominence is noted at the lung apices; this may be transient in nature. No significant soft tissue abnormalities are seen.  IMPRESSION: 1. No evidence of traumatic intracranial injury or fracture. 2. No evidence of fracture or subluxation along the cervical spine. 3. Mild to moderate age-appropriate cortical volume loss and scattered small vessel ischemic microangiopathy. 4. Degenerative change along the cervical spine. 5. Mild interstitial prominence at the lung apices; this may be transient in nature.   Electronically Signed   By: Roanna Raider M.D.   On: 04/11/2013 00:21    EKG Interpretation   None       MDM   1. Fall at nursing home, initial encounter    79 year old female status post fall.  Patient is a diabetic, we'll check CBG.  Plan for CT scans of head and C-spine.  We'll get imaging of her right leg.  We'll check labs and urine.  Workup unremarkable.  Pt now more alert, smiling.  Will attempt to answer questions, appears demented.  Plan to d/c back to nursing facility.    Olivia Mackie, MD 04/11/13 [phone]

## 2013-04-11 LAB — URINALYSIS, ROUTINE W REFLEX MICROSCOPIC
Bilirubin Urine: NEGATIVE
Glucose, UA: 250 mg/dL — AB
Hgb urine dipstick: NEGATIVE
Specific Gravity, Urine: 1.008 (ref 1.005–1.030)

## 2013-04-11 LAB — CBC WITH DIFFERENTIAL/PLATELET
Eosinophils Relative: 3 % (ref 0–5)
Hemoglobin: 13.5 g/dL (ref 12.0–15.0)
Lymphocytes Relative: 22 % (ref 12–46)
Lymphs Abs: 1.7 10*3/uL (ref 0.7–4.0)
MCH: 30.1 pg (ref 26.0–34.0)
MCV: 86.9 fL (ref 78.0–100.0)
Monocytes Relative: 12 % (ref 3–12)
Platelets: 226 10*3/uL (ref 150–400)
RBC: 4.49 MIL/uL (ref 3.87–5.11)
WBC: 7.4 10*3/uL (ref 4.0–10.5)

## 2013-04-11 LAB — BASIC METABOLIC PANEL
BUN: 31 mg/dL — ABNORMAL HIGH (ref 6–23)
CO2: 26 mEq/L (ref 19–32)
Calcium: 9.2 mg/dL (ref 8.4–10.5)
Glucose, Bld: 265 mg/dL — ABNORMAL HIGH (ref 70–99)
Potassium: 4.6 mEq/L (ref 3.5–5.1)
Sodium: 136 mEq/L (ref 135–145)

## 2013-04-11 LAB — GLUCOSE, CAPILLARY: Glucose-Capillary: 249 mg/dL — ABNORMAL HIGH (ref 70–99)

## 2013-04-11 NOTE — Progress Notes (Signed)
[facility] called for transport back to [facility].

## 2013-04-11 NOTE — Progress Notes (Signed)
Report called to Windell Moulding, Charity fundraiser at [facility].

## 2013-04-17 ENCOUNTER — Emergency Department (HOSPITAL_COMMUNITY): Payer: Medicare Other

## 2013-04-17 ENCOUNTER — Encounter (HOSPITAL_COMMUNITY): Payer: Self-pay | Admitting: Emergency Medicine

## 2013-04-17 ENCOUNTER — Other Ambulatory Visit: Payer: Self-pay

## 2013-04-17 ENCOUNTER — Emergency Department (HOSPITAL_COMMUNITY)
Admission: EM | Admit: 2013-04-17 | Discharge: 2013-04-17 | Disposition: A | Discharge status: Skilled Nursing Facility | Payer: Medicare Other | Attending: Emergency Medicine | Admitting: Emergency Medicine

## 2013-04-17 DIAGNOSIS — Z8739 Personal history of other diseases of the musculoskeletal system and connective tissue: Secondary | ICD-10-CM | POA: Insufficient documentation

## 2013-04-17 DIAGNOSIS — I129 Hypertensive chronic kidney disease with stage 1 through stage 4 chronic kidney disease, or unspecified chronic kidney disease: Secondary | ICD-10-CM | POA: Insufficient documentation

## 2013-04-17 DIAGNOSIS — K219 Gastro-esophageal reflux disease without esophagitis: Secondary | ICD-10-CM | POA: Insufficient documentation

## 2013-04-17 DIAGNOSIS — E1139 Type 2 diabetes mellitus with other diabetic ophthalmic complication: Secondary | ICD-10-CM | POA: Insufficient documentation

## 2013-04-17 DIAGNOSIS — F0390 Unspecified dementia without behavioral disturbance: Secondary | ICD-10-CM | POA: Insufficient documentation

## 2013-04-17 DIAGNOSIS — Z79899 Other long term (current) drug therapy: Secondary | ICD-10-CM | POA: Insufficient documentation

## 2013-04-17 DIAGNOSIS — N189 Chronic kidney disease, unspecified: Secondary | ICD-10-CM | POA: Insufficient documentation

## 2013-04-17 DIAGNOSIS — W1809XA Striking against other object with subsequent fall, initial encounter: Secondary | ICD-10-CM | POA: Insufficient documentation

## 2013-04-17 DIAGNOSIS — Z794 Long term (current) use of insulin: Secondary | ICD-10-CM | POA: Insufficient documentation

## 2013-04-17 DIAGNOSIS — Z8711 Personal history of peptic ulcer disease: Secondary | ICD-10-CM | POA: Insufficient documentation

## 2013-04-17 DIAGNOSIS — Z880 Allergy status to penicillin: Secondary | ICD-10-CM | POA: Insufficient documentation

## 2013-04-17 DIAGNOSIS — J189 Pneumonia, unspecified organism: Secondary | ICD-10-CM | POA: Insufficient documentation

## 2013-04-17 DIAGNOSIS — I252 Old myocardial infarction: Secondary | ICD-10-CM | POA: Insufficient documentation

## 2013-04-17 DIAGNOSIS — Y939 Activity, unspecified: Secondary | ICD-10-CM | POA: Insufficient documentation

## 2013-04-17 DIAGNOSIS — Z7982 Long term (current) use of aspirin: Secondary | ICD-10-CM | POA: Insufficient documentation

## 2013-04-17 DIAGNOSIS — Y9210 Unspecified residential institution as the place of occurrence of the external cause: Secondary | ICD-10-CM | POA: Insufficient documentation

## 2013-04-17 DIAGNOSIS — E785 Hyperlipidemia, unspecified: Secondary | ICD-10-CM | POA: Insufficient documentation

## 2013-04-17 DIAGNOSIS — R21 Rash and other nonspecific skin eruption: Secondary | ICD-10-CM | POA: Insufficient documentation

## 2013-04-17 DIAGNOSIS — Z8659 Personal history of other mental and behavioral disorders: Secondary | ICD-10-CM | POA: Insufficient documentation

## 2013-04-17 LAB — BASIC METABOLIC PANEL
BUN: 28 mg/dL — ABNORMAL HIGH (ref 6–23)
CO2: 29 mEq/L (ref 19–32)
Calcium: 9.5 mg/dL (ref 8.4–10.5)
Creatinine, Ser: 0.93 mg/dL (ref 0.50–1.10)
GFR calc Af Amer: 59 mL/min — ABNORMAL LOW (ref >90)
GFR calc non Af Amer: 51 mL/min — ABNORMAL LOW (ref >90)
Glucose, Bld: 286 mg/dL — ABNORMAL HIGH (ref 70–99)

## 2013-04-17 LAB — POCT I-STAT, CHEM 8
Calcium, Ion: 1.24 mmol/L (ref 1.13–1.30)
Chloride: 102 mEq/L (ref 96–112)
Creatinine, Ser: 1 mg/dL (ref 0.50–1.10)
Glucose, Bld: 277 mg/dL — ABNORMAL HIGH (ref 70–99)
HCT: 43 % (ref 36.0–46.0)
Hemoglobin: 14.6 g/dL (ref 12.0–15.0)

## 2013-04-17 LAB — CBC
HCT: 39.8 % (ref 36.0–46.0)
Hemoglobin: 13.5 g/dL (ref 12.0–15.0)
MCH: 29.9 pg (ref 26.0–34.0)
MCHC: 33.9 g/dL (ref 30.0–36.0)
MCV: 88.2 fL (ref 78.0–100.0)
Platelets: 226 10*3/uL (ref 150–400)
RBC: 4.51 MIL/uL (ref 3.87–5.11)

## 2013-04-17 LAB — GLUCOSE, CAPILLARY: Glucose-Capillary: 236 mg/dL — ABNORMAL HIGH (ref 70–99)

## 2013-04-17 LAB — POCT I-STAT TROPONIN I: Troponin i, poc: 0.01 ng/mL (ref 0.00–0.08)

## 2013-04-17 MED ORDER — LEVOFLOXACIN 500 MG PO TABS
750.0000 mg | ORAL_TABLET | Freq: Every day | ORAL | Status: DC
  Administered 2013-04-17: 750 mg via ORAL
  Filled 2013-04-17: qty 2

## 2013-04-17 MED ORDER — LEVOFLOXACIN 750 MG PO TABS: 750.0000 mg | ORAL_TABLET | Freq: Every day | ORAL | Status: DC

## 2013-04-17 NOTE — ED Notes (Signed)
Patient transported to CT 

## 2013-04-17 NOTE — ED Provider Notes (Signed)
CSN: [id number]     Arrival date & time 04/17/13  0754 History   First MD Initiated Contact with Patient 04/17/13 0756     Chief Complaint  Patient presents with  . Fall   (Consider location/radiation/quality/duration/timing/severity/associated sxs/prior Treatment) Patient is a 79 y.o. female presenting with fall. The history is provided by the EMS personnel and the nursing home.  Fall This is a recurrent problem. The current episode started less than 1 hour ago (found down on floor, unwitnessed fall, found <1hr ago). Episode frequency: once. The problem has not changed since onset.Pertinent negatives include no chest pain, no abdominal pain, no headaches and no shortness of breath. Nothing aggravates the symptoms. Nothing relieves the symptoms.    Past Medical History  Diagnosis Date  . 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 Esophageal reflux   . Osteoporosis, unspecified   . Type II or unspecified type diabetes mellitus with ophthalmic manifestations, not stated as uncontrolled(250.50)   . Chronic kidney disease, unspecified   . Other and unspecified hyperlipidemia   . Type II or unspecified type diabetes mellitus without mention of complication, not stated as uncontrolled   . Mitral valve insufficiency and aortic valve insufficiency   . Acute myocardial infarction, unspecified site, episode of care unspecified   . Atrial fibrillation   . Peptic ulcer, unspecified site, unspecified as acute or chronic, without mention of hemorrhage, perforation, or obstruction   . 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 Esophageal reflux   . Osteoporosis, unspecified   . Type II or unspecified type diabetes mellitus with ophthalmic manifestations, not stated as uncontrolled(250.50)   . Chronic kidney disease, unspecified   . Type II or unspecified type diabetes mellitus without mention of complication, not stated as  uncontrolled   . Mitral valve insufficiency and aortic valve insufficiency   . Acute myocardial infarction, unspecified site, episode of care unspecified   . Atrial fibrillation   . Hypertension   . Renal insufficiency   . Stasis dermatitis 2010    Dr. Terri Piedra   History reviewed. No pertinent past surgical history. Family History  Problem Relation Age of Onset  . Hypertension Other   . Hypertension Mother   . Diabetes Mother   . Hypertension Father    History  Substance Use Topics  . Smoking status: Never Smoker   . Smokeless tobacco: Not on file  . Alcohol Use: No   OB History   Grav Para Term Preterm Abortions TAB SAB Ect Mult Living                 Review of Systems  Unable to perform ROS: Dementia  Respiratory: Negative for shortness of breath.   Cardiovascular: Negative for chest pain.  Gastrointestinal: Negative for abdominal pain.  Neurological: Negative for headaches.    Allergies  Penicillins  Home Medications   Current Outpatient Rx  Name  Route  Sig  Dispense  Refill  . aspirin 81 MG tablet   Oral   Take 81 mg by mouth daily.           . cholecalciferol (VITAMIN D) 400 UNITS TABS   Oral   Take 400 Units by mouth every morning.         . docusate sodium (COLACE) 100 MG capsule   Oral   Take 100 mg by mouth daily. For constipation         . esomeprazole (NEXIUM) 40 MG capsule  Oral   Take 40 mg by mouth every morning.         . furosemide (LASIX) 20 MG tablet   Oral   Take 20 mg by mouth.         . galantamine (RAZADYNE) 12 MG tablet   Oral   Take 12 mg by mouth 2 (two) times daily.         . insulin detemir (LEVEMIR) 100 UNIT/ML injection   Subcutaneous   Inject 30 Units into the skin daily. In morning         . insulin lispro (HUMALOG KWIKPEN) 100 UNIT/ML SOPN   Subcutaneous   Inject 3 Units into the skin 3 (three) times daily with meals. HOLD IF BS <200 or does not eat         . lisinopril (PRINIVIL,ZESTRIL) 2.5 MG  tablet   Oral   Take 2.5 mg by mouth daily.         . multivitamin-lutein (OCUVITE-LUTEIN) CAPS   Oral   Take 1 capsule by mouth 2 (two) times daily with a meal.         . nystatin cream (MYCOSTATIN)   Topical   Apply 1 application topically 2 (two) times daily. Both breast         . pravastatin (PRAVACHOL) 20 MG tablet   Oral   Take 20 mg by mouth at bedtime.         . senna (SENOKOT) 8.6 MG TABS tablet   Oral   Take 2 tablets by mouth 2 (two) times daily as needed (for constipation).         . vitamin B-12 (CYANOCOBALAMIN) 1000 MCG tablet   Oral   Take 1,000 mcg by mouth every other day.          . zinc oxide 20 % ointment   Topical   Apply 1 application topically 2 (two) times daily as needed for dry skin.          BP 111/44  Pulse 83  Resp 16  SpO2 98% Physical Exam  Nursing note and vitals reviewed. Constitutional: She appears well-developed and well-nourished. No distress.  HENT:  Head: Normocephalic and atraumatic.  Eyes: EOM are normal. Pupils are equal, round, and reactive to light.  Neck: Normal range of motion. Neck supple.  Cardiovascular: Normal rate and regular rhythm.  Exam reveals no friction rub.   No murmur heard. Pulmonary/Chest: Effort normal and breath sounds normal. No respiratory distress. She has no wheezes. She has no rales.  Abdominal: Soft. She exhibits no distension. There is no tenderness. There is no rebound.  Musculoskeletal: Normal range of motion. She exhibits no edema.       Right hip: She exhibits no bony tenderness.       Left hip: She exhibits no bony tenderness.  Pelvis stable with deformity Chest stable  Neurological: She is alert. She exhibits normal muscle tone.  Skin: Rash noted. She is not diaphoretic.    ED Course  Procedures (including critical care time) Labs Review Labs Reviewed - No data to display Imaging Review No results found.  EKG Interpretation   None      Date: 04/17/2013  Rate: 80   Rhythm: normal sinus rhythm  QRS Axis: normal  Intervals: PR prolonged  ST/T Wave abnormalities: flipped T waves in III, aVF. Previously seen in III, new in aVF  Conduction Disutrbances:right bundle branch block  Narrative Interpretation:   Old EKG Reviewed: changes noted  MDM   1. Lingular pneumonia    79 year old female presents status post an unwitnessed fall. She was found on the floor with her head resting against a nightstand. She has history of dementia and is acting normally per the staff. She denied any pain to EMS. Here her vitals are stable, she is relaxing comfortably in bed. She denies any acute pain. She has no upper or lower extremity deformity. It is clear lungs. She has a soft belly without pain or tenderness. She has no spinal tenderness or deformities. Scan her head and her C-spine. Will also x-ray her chest and pelvis. Will get an EKG and check a chem panel. Of note, she was seen one week ago for similar. Scan was normal at that time. CXR with possible lingular opacity and some suspicious lung nodules. With normal white count, stable vitals, will send her back to her nursing home with Levaquin as admission during flu season would put her at risk for hospital acquired infection.  I spoke with Wille Celeste, the NP at [facility], and informed her about the possible pneumonia and that the patient would be returning with PO Levaquin. She acknowledged and will see her today. Stable for discharge.   Dagmar Hait, MD 04/17/13 [phone]

## 2013-04-17 NOTE — ED Notes (Signed)
Called ptar by Gerber Penza @ 10:37 am 04/17/13

## 2013-04-17 NOTE — ED Notes (Addendum)
Per EMS. Pt from [facility] with dementia. Pt had unwitnessed fall this am. Staff state they found pt on floor with head propped against nightstand. Pt has hx dementia and is acting per norm according to staff. Pt denied pain to EMS. Pt had fall a week ago with L hip pain. Pt was able to ambulate with aide after fall

## 2013-04-17 NOTE — ED Notes (Signed)
Bed: WA14 Expected date:  Expected time:  Means of arrival:  Comments: EMS-fall 

## 2013-04-19 ENCOUNTER — Emergency Department (HOSPITAL_COMMUNITY): Payer: Medicare Other

## 2013-04-19 ENCOUNTER — Emergency Department (HOSPITAL_COMMUNITY)
Admission: EM | Admit: 2013-04-19 | Discharge: 2013-04-20 | Disposition: A | Discharge status: Home / Self Care | Payer: Medicare Other | Attending: Emergency Medicine | Admitting: Emergency Medicine

## 2013-04-19 ENCOUNTER — Encounter (HOSPITAL_COMMUNITY): Payer: Self-pay | Admitting: Emergency Medicine

## 2013-04-19 DIAGNOSIS — N390 Urinary tract infection, site not specified: Secondary | ICD-10-CM | POA: Insufficient documentation

## 2013-04-19 DIAGNOSIS — I252 Old myocardial infarction: Secondary | ICD-10-CM | POA: Insufficient documentation

## 2013-04-19 DIAGNOSIS — Z8739 Personal history of other diseases of the musculoskeletal system and connective tissue: Secondary | ICD-10-CM | POA: Insufficient documentation

## 2013-04-19 DIAGNOSIS — R739 Hyperglycemia, unspecified: Secondary | ICD-10-CM

## 2013-04-19 DIAGNOSIS — Z7982 Long term (current) use of aspirin: Secondary | ICD-10-CM | POA: Insufficient documentation

## 2013-04-19 DIAGNOSIS — K219 Gastro-esophageal reflux disease without esophagitis: Secondary | ICD-10-CM | POA: Insufficient documentation

## 2013-04-19 DIAGNOSIS — F068 Other specified mental disorders due to known physiological condition: Secondary | ICD-10-CM | POA: Insufficient documentation

## 2013-04-19 DIAGNOSIS — I129 Hypertensive chronic kidney disease with stage 1 through stage 4 chronic kidney disease, or unspecified chronic kidney disease: Secondary | ICD-10-CM | POA: Insufficient documentation

## 2013-04-19 DIAGNOSIS — E11319 Type 2 diabetes mellitus with unspecified diabetic retinopathy without macular edema: Secondary | ICD-10-CM | POA: Insufficient documentation

## 2013-04-19 DIAGNOSIS — Z8669 Personal history of other diseases of the nervous system and sense organs: Secondary | ICD-10-CM | POA: Insufficient documentation

## 2013-04-19 DIAGNOSIS — Z880 Allergy status to penicillin: Secondary | ICD-10-CM | POA: Insufficient documentation

## 2013-04-19 DIAGNOSIS — I4891 Unspecified atrial fibrillation: Secondary | ICD-10-CM | POA: Insufficient documentation

## 2013-04-19 DIAGNOSIS — E785 Hyperlipidemia, unspecified: Secondary | ICD-10-CM | POA: Insufficient documentation

## 2013-04-19 DIAGNOSIS — Z792 Long term (current) use of antibiotics: Secondary | ICD-10-CM | POA: Insufficient documentation

## 2013-04-19 DIAGNOSIS — N189 Chronic kidney disease, unspecified: Secondary | ICD-10-CM | POA: Insufficient documentation

## 2013-04-19 DIAGNOSIS — E1139 Type 2 diabetes mellitus with other diabetic ophthalmic complication: Secondary | ICD-10-CM | POA: Insufficient documentation

## 2013-04-19 DIAGNOSIS — Z794 Long term (current) use of insulin: Secondary | ICD-10-CM | POA: Insufficient documentation

## 2013-04-19 DIAGNOSIS — Z79899 Other long term (current) drug therapy: Secondary | ICD-10-CM | POA: Insufficient documentation

## 2013-04-19 LAB — CBC WITH DIFFERENTIAL/PLATELET
Basophils Relative: 1 % (ref 0–1)
Eosinophils Absolute: 0.1 10*3/uL (ref 0.0–0.7)
Eosinophils Relative: 2 % (ref 0–5)
Hemoglobin: 13.2 g/dL (ref 12.0–15.0)
Lymphs Abs: 1.2 10*3/uL (ref 0.7–4.0)
MCH: 30.8 pg (ref 26.0–34.0)
MCHC: 34.1 g/dL (ref 30.0–36.0)
MCV: 90.4 fL (ref 78.0–100.0)
Monocytes Absolute: 0.8 10*3/uL (ref 0.1–1.0)
Monocytes Relative: 10 % (ref 3–12)
Neutrophils Relative %: 72 % (ref 43–77)
RBC: 4.28 MIL/uL (ref 3.87–5.11)
RDW: 13.2 % (ref 11.5–15.5)

## 2013-04-19 LAB — URINALYSIS, ROUTINE W REFLEX MICROSCOPIC
Bilirubin Urine: NEGATIVE
Glucose, UA: >1000 mg/dL — AB
Ketones, ur: NEGATIVE mg/dL
Protein, ur: NEGATIVE mg/dL
Urobilinogen, UA: 0.2 mg/dL (ref 0.0–1.0)

## 2013-04-19 LAB — BASIC METABOLIC PANEL
BUN: 33 mg/dL — ABNORMAL HIGH (ref 6–23)
CO2: 28 mEq/L (ref 19–32)
Creatinine, Ser: 1.23 mg/dL — ABNORMAL HIGH (ref 0.50–1.10)
GFR calc non Af Amer: 36 mL/min — ABNORMAL LOW (ref >90)
Glucose, Bld: 585 mg/dL (ref 70–99)
Potassium: 4.4 mEq/L (ref 3.7–5.3)

## 2013-04-19 LAB — URINE MICROSCOPIC-ADD ON

## 2013-04-19 LAB — POCT I-STAT TROPONIN I: Troponin i, poc: 0.02 ng/mL (ref 0.00–0.08)

## 2013-04-19 LAB — GLUCOSE, CAPILLARY

## 2013-04-19 MED ORDER — INSULIN ASPART 100 UNIT/ML ~~LOC~~ SOLN
10.0000 [IU] | Freq: Once | SUBCUTANEOUS | Status: AC
  Administered 2013-04-19: 10 [IU] via INTRAVENOUS
  Filled 2013-04-19: qty 1

## 2013-04-19 MED ORDER — SODIUM CHLORIDE 0.9 % IV BOLUS (SEPSIS)
500.0000 mL | Freq: Once | INTRAVENOUS | Status: AC
  Administered 2013-04-19: 500 mL via INTRAVENOUS

## 2013-04-19 MED ORDER — DEXTROSE 5 % IV SOLN
1.0000 g | Freq: Once | INTRAVENOUS | Status: AC
  Administered 2013-04-19: 1 g via INTRAVENOUS
  Filled 2013-04-19: qty 10

## 2013-04-19 NOTE — ED Notes (Signed)
Patient presents to ED via [facility] from [facility]- staff called EMS due to patient being hyperglycemic all day. CBG's have been running between 400-500 all day and the last one taken just before EMS arrival read "High" on CBG machine. Pt has received 2 doses on insulin today at [facility] place, last dose given at 1700 per EMS, unknown amount. Pt has hx of dementia. Mental status is at baseline per staff and EMS. A&Ox1 upon arrival to ED.

## 2013-04-19 NOTE — ED Notes (Signed)
Pt's CBG is 573 mg/dl.RN notified.

## 2013-04-19 NOTE — ED Provider Notes (Signed)
CSN: [id number]     Arrival date & time 04/19/13  1918 History   First MD Initiated Contact with Patient 04/19/13 1926     Chief Complaint  Patient presents with  . Hyperglycemia   (Consider location/radiation/quality/duration/timing/severity/associated sxs/prior Treatment) Patient is a 79 y.o. female presenting with hyperglycemia. The history is provided by the nursing home, the EMS personnel and medical records.  Hyperglycemia  Level V caveat: Dementia. This is a 79 year old female with past medical history significant for A. fib, DM2, hypertension, chronic kidney disease, PUD, MV insufficiency, presenting to the ED from [facility] for hyperglycemia.    I personally contacted facility--Per nursing staff pts blood sugar has been elevated all day-- this morning was 418, this afternoon around 1630 was 591, checked again after dinner was read as "high".  Nurse does note the patient's dinnertime PRN insulin insulin was recently discontinued. States blood sugars have been fluctuating since then. Patient was seen in the ED 3 days ago for a fall and diagnosed with pneumonia, she was started on Levaquin at that time. There are no other noted medication changes. Per nursing staff patient has appeared more fatigued than normal, but she slept all day until dinner time. She has been more quiet than normal and not participating in normal activities. Staff notes she appears at her baseline mentality-- has continued laughing and responding to verbal stimuli as she normally does.  Pt is full code.  VS stable on arrival.   Past Medical History  Diagnosis Date  . 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 Esophageal reflux   . Osteoporosis, unspecified   . Type II or unspecified type diabetes mellitus with ophthalmic manifestations, not stated as uncontrolled(250.50)   . Chronic kidney disease, unspecified   . Other and unspecified hyperlipidemia   . Type II or  unspecified type diabetes mellitus without mention of complication, not stated as uncontrolled   . Mitral valve insufficiency and aortic valve insufficiency   . Acute myocardial infarction, unspecified site, episode of care unspecified   . Atrial fibrillation   . Peptic ulcer, unspecified site, unspecified as acute or chronic, without mention of hemorrhage, perforation, or obstruction   . 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 Esophageal reflux   . Osteoporosis, unspecified   . Type II or unspecified type diabetes mellitus with ophthalmic manifestations, not stated as uncontrolled(250.50)   . Chronic kidney disease, unspecified   . Type II or unspecified type diabetes mellitus without mention of complication, not stated as uncontrolled   . Mitral valve insufficiency and aortic valve insufficiency   . Acute myocardial infarction, unspecified site, episode of care unspecified   . Atrial fibrillation   . Hypertension   . Renal insufficiency   . Stasis dermatitis 2010    Dr. Terri Piedra   History reviewed. No pertinent past surgical history. Family History  Problem Relation Age of Onset  . Hypertension Other   . Hypertension Mother   . Diabetes Mother   . Hypertension Father    History  Substance Use Topics  . Smoking status: Never Smoker   . Smokeless tobacco: Not on file  . Alcohol Use: No   OB History   Grav Para Term Preterm Abortions TAB SAB Ect Mult Living                 Review of Systems  Unable to perform ROS: Dementia    Allergies  Penicillins  Home Medications  Current Outpatient Rx  Name  Route  Sig  Dispense  Refill  . aspirin 81 MG tablet   Oral   Take 81 mg by mouth daily.           . cholecalciferol (VITAMIN D) 400 UNITS TABS   Oral   Take 400 Units by mouth every morning.         . docusate sodium (COLACE) 100 MG capsule   Oral   Take 100 mg by mouth daily. For constipation         . esomeprazole  (NEXIUM) 40 MG capsule   Oral   Take 40 mg by mouth every morning.         . furosemide (LASIX) 20 MG tablet   Oral   Take 20 mg by mouth.         . galantamine (RAZADYNE) 12 MG tablet   Oral   Take 12 mg by mouth 2 (two) times daily.         . insulin detemir (LEVEMIR) 100 UNIT/ML injection   Subcutaneous   Inject 30 Units into the skin daily. In morning         . insulin lispro (HUMALOG KWIKPEN) 100 UNIT/ML SOPN   Subcutaneous   Inject 3 Units into the skin 2 (two) times daily. HOLD IF BS <200 or does not eat         . levofloxacin (LEVAQUIN) 750 MG tablet   Oral   Take 1 tablet (750 mg total) by mouth daily.   10 tablet   0   . lisinopril (PRINIVIL,ZESTRIL) 2.5 MG tablet   Oral   Take 2.5 mg by mouth daily.         . multivitamin-lutein (OCUVITE-LUTEIN) CAPS   Oral   Take 1 capsule by mouth 2 (two) times daily with a meal.         . nystatin cream (MYCOSTATIN)   Topical   Apply 1 application topically 2 (two) times daily. Both breast         . pravastatin (PRAVACHOL) 20 MG tablet   Oral   Take 20 mg by mouth at bedtime.         . senna (SENOKOT) 8.6 MG TABS tablet   Oral   Take 2 tablets by mouth 2 (two) times daily as needed (for constipation).         . vitamin B-12 (CYANOCOBALAMIN) 1000 MCG tablet   Oral   Take 1,000 mcg by mouth every other day.          . zinc oxide 20 % ointment   Topical   Apply 1 application topically 2 (two) times daily as needed for dry skin.          BP 128/57  Pulse 87  Temp(Src) 97.9 F (36.6 C) (Oral)  Resp 20  SpO2 100%  Physical Exam  Nursing note and vitals reviewed. Constitutional: She appears well-developed and well-nourished. No distress.  HENT:  Head: Normocephalic and atraumatic.  Mouth/Throat: Oropharynx is clear and moist.  Eyes: Conjunctivae and EOM are normal. Pupils are equal, round, and reactive to light.  Neck: Normal range of motion. Neck supple.  Cardiovascular: Normal  rate, regular rhythm and normal heart sounds.   Pulmonary/Chest: Effort normal and breath sounds normal. Not tachypneic. No respiratory distress. She has no wheezes. She has no rhonchi.  Abdominal: Soft. Bowel sounds are normal. There is no tenderness. There is no guarding.  Musculoskeletal: Normal range of motion.  Neurological: She is alert.  Awake, alert; responding "i don't know" to all questions asked; spontaneously moves all extremities; gait not tested  Skin: Skin is warm and dry. She is not diaphoretic.  Psychiatric: She has a normal mood and affect.    ED Course  Procedures (including critical care time)   Date: 04/20/2013  Rate: 86  Rhythm: normal sinus rhythm  QRS Axis: normal  Intervals: PR prolonged  ST/T Wave abnormalities: normal  Conduction Disutrbances:right bundle branch block  Narrative Interpretation:   Old EKG Reviewed: unchanged   Labs Review Labs Reviewed  BASIC METABOLIC PANEL - Abnormal; Notable for the following:    Glucose, Bld 585 (*)    BUN 33 (*)    Creatinine, Ser 1.23 (*)    GFR calc non Af Amer 36 (*)    GFR calc Af Amer 42 (*)    All other components within normal limits  URINALYSIS, ROUTINE W REFLEX MICROSCOPIC - Abnormal; Notable for the following:    APPearance HAZY (*)    Glucose, UA >1000 (*)    Hgb urine dipstick TRACE (*)    Nitrite POSITIVE (*)    Leukocytes, UA TRACE (*)    All other components within normal limits  GLUCOSE, CAPILLARY - Abnormal; Notable for the following:    Glucose-Capillary 573 (*)    All other components within normal limits  URINE MICROSCOPIC-ADD ON - Abnormal; Notable for the following:    Bacteria, UA MANY (*)    All other components within normal limits  GLUCOSE, CAPILLARY - Abnormal; Notable for the following:    Glucose-Capillary 162 (*)    All other components within normal limits  URINE CULTURE  CBC WITH DIFFERENTIAL  POCT I-STAT TROPONIN I   Imaging Review Dg Chest Port 1 View  04/19/2013    CLINICAL DATA:  CAP, hyperglycemia.  EXAM: PORTABLE CHEST - 1 VIEW  COMPARISON:  04/17/2013 and 04/18/2009  FINDINGS: Lungs are hypoinflated without consolidation or effusion. There is borderline cardiomegaly. There is calcified plaque over the thoracic aorta. Remainder of the exam is unchanged.  IMPRESSION: No active disease.  Borderline cardiomegaly.   Electronically Signed   By: Elberta Fortis M.D.   On: 04/19/2013 20:02    EKG Interpretation   None       MDM   1. Hyperglycemia   2. UTI (lower urinary tract infection)    EKG NSR, no acute ischemic changes.  Trop negative.  CXR with resolution of infiltrate from 2 days ago.  Lab work with elevated glucose of 585, electrolytes WNL, anion gap of 12.  U/a with signs of infection, no ketones.  Pt clinically not in DKA.  Pt rehydrated with IVF, given dose of insulin with improvement of blood sugar to 162.  Dose of IV rocephin given in the ED.  Will stop levofloxacin and start on keflex for UTI.  Also advised to re-start mealtime humalog for better glucose control.  Pt cleared to return to facility, report called by nursing staff.  Discussed with Dr. Patria Mane who agrees with assessment and plan of care.  Garlon Hatchet, PA-C 04/20/13 [phone]

## 2013-04-20 LAB — GLUCOSE, CAPILLARY: Glucose-Capillary: 162 mg/dL — ABNORMAL HIGH (ref 70–99)

## 2013-04-20 MED ORDER — CEPHALEXIN 500 MG PO CAPS: 500.0000 mg | ORAL_CAPSULE | Freq: Three times a day (TID) | ORAL | Status: DC

## 2013-04-20 MED ORDER — INSULIN LISPRO 100 UNIT/ML (KWIKPEN): 3.0000 [IU] | PEN_INJECTOR | Freq: Three times a day (TID) | SUBCUTANEOUS | Status: DC

## 2013-04-20 NOTE — ED Notes (Signed)
Waiting on [facility] for patient transport.

## 2013-04-20 NOTE — Discharge Instructions (Signed)
Stop taking levofloxacin.  Start taking keflex. Re-start mealtime Humalog with hold precautions for BS <200 or pt does not eat. Return to the ED for new concerns.

## 2013-04-20 NOTE — ED Provider Notes (Signed)
Medical screening examination/treatment/procedure(s) were conducted as a shared visit with non-physician practitioner(s) and myself.  I personally evaluated the patient during the encounter.  EKG Interpretation   None       Urinary tract infection.  Rocephin given.  Patient be started on Keflex as an outpatient.  Low clinical suspicion for pneumonia.  Will DC Levaquin at this time.  Blood sugars improving.  We'll restart nighttime insulin at the nursing home.  Lyanne CoKevin M Matea Stanard, MD 04/20/13 0157

## 2013-04-22 LAB — URINE CULTURE: Colony Count: 25000

## 2013-04-23 ENCOUNTER — Telehealth (HOSPITAL_COMMUNITY): Payer: Self-pay | Admitting: Emergency Medicine

## 2013-04-23 NOTE — ED Notes (Signed)
Post ED Visit - Positive Culture Follow-up  Culture report reviewed by antimicrobial stewardship pharmacist: [x]  Wes Dulaney, Pharm.D., BCPS []  Celedonio MiyamotoJeremy Frens, Pharm.D., BCPS []  Georgina PillionElizabeth Martin, [address] BCPS []  PoplarMinh Pham, [address]Ph[address]., BCPS, AAHIVP []  Estella HuskMichelle Turner, Pharm.D., BCPS, AAHIVP  Positive urine culture Treated with Keflex, organism sensitive to the same and no further patient follow-up is required at this time.  [place]erton[place]land, Jenel LucksKylie 04/23/2013, 6:03 PM

## 2014-01-11 ENCOUNTER — Inpatient Hospital Stay (HOSPITAL_COMMUNITY): Payer: Medicare Other

## 2014-01-11 ENCOUNTER — Inpatient Hospital Stay (HOSPITAL_COMMUNITY)
Admission: EM | Admit: 2014-01-11 | Discharge: 2014-01-17 | DRG: 871 | Disposition: A | Discharge status: Hospice | Payer: Medicare Other | Attending: Internal Medicine | Admitting: Internal Medicine

## 2014-01-11 ENCOUNTER — Encounter (HOSPITAL_COMMUNITY): Payer: Self-pay | Admitting: Emergency Medicine

## 2014-01-11 ENCOUNTER — Emergency Department (HOSPITAL_COMMUNITY): Payer: Medicare Other

## 2014-01-11 DIAGNOSIS — G929 Unspecified toxic encephalopathy: Secondary | ICD-10-CM | POA: Diagnosis present

## 2014-01-11 DIAGNOSIS — M169 Osteoarthritis of hip, unspecified: Secondary | ICD-10-CM | POA: Diagnosis present

## 2014-01-11 DIAGNOSIS — N182 Chronic kidney disease, stage 2 (mild): Secondary | ICD-10-CM | POA: Diagnosis present

## 2014-01-11 DIAGNOSIS — N170 Acute kidney failure with tubular necrosis: Secondary | ICD-10-CM | POA: Diagnosis present

## 2014-01-11 DIAGNOSIS — K219 Gastro-esophageal reflux disease without esophagitis: Secondary | ICD-10-CM | POA: Diagnosis present

## 2014-01-11 DIAGNOSIS — F068 Other specified mental disorders due to known physiological condition: Secondary | ICD-10-CM | POA: Diagnosis present

## 2014-01-11 DIAGNOSIS — R10819 Abdominal tenderness, unspecified site: Secondary | ICD-10-CM

## 2014-01-11 DIAGNOSIS — M1610 Unilateral primary osteoarthritis, unspecified hip: Secondary | ICD-10-CM | POA: Diagnosis present

## 2014-01-11 DIAGNOSIS — E1165 Type 2 diabetes mellitus with hyperglycemia: Secondary | ICD-10-CM

## 2014-01-11 DIAGNOSIS — Z794 Long term (current) use of insulin: Secondary | ICD-10-CM

## 2014-01-11 DIAGNOSIS — Z7982 Long term (current) use of aspirin: Secondary | ICD-10-CM | POA: Diagnosis not present

## 2014-01-11 DIAGNOSIS — M810 Age-related osteoporosis without current pathological fracture: Secondary | ICD-10-CM | POA: Diagnosis present

## 2014-01-11 DIAGNOSIS — I129 Hypertensive chronic kidney disease with stage 1 through stage 4 chronic kidney disease, or unspecified chronic kidney disease: Secondary | ICD-10-CM | POA: Diagnosis present

## 2014-01-11 DIAGNOSIS — Z66 Do not resuscitate: Secondary | ICD-10-CM | POA: Diagnosis present

## 2014-01-11 DIAGNOSIS — N390 Urinary tract infection, site not specified: Secondary | ICD-10-CM

## 2014-01-11 DIAGNOSIS — R509 Fever, unspecified: Secondary | ICD-10-CM | POA: Diagnosis not present

## 2014-01-11 DIAGNOSIS — Z515 Encounter for palliative care: Secondary | ICD-10-CM | POA: Diagnosis not present

## 2014-01-11 DIAGNOSIS — F0390 Unspecified dementia without behavioral disturbance: Secondary | ICD-10-CM | POA: Diagnosis present

## 2014-01-11 DIAGNOSIS — I4891 Unspecified atrial fibrillation: Secondary | ICD-10-CM | POA: Diagnosis present

## 2014-01-11 DIAGNOSIS — Z8249 Family history of ischemic heart disease and other diseases of the circulatory system: Secondary | ICD-10-CM | POA: Diagnosis not present

## 2014-01-11 DIAGNOSIS — I080 Rheumatic disorders of both mitral and aortic valves: Secondary | ICD-10-CM | POA: Diagnosis present

## 2014-01-11 DIAGNOSIS — IMO0001 Reserved for inherently not codable concepts without codable children: Secondary | ICD-10-CM

## 2014-01-11 DIAGNOSIS — E1139 Type 2 diabetes mellitus with other diabetic ophthalmic complication: Secondary | ICD-10-CM

## 2014-01-11 DIAGNOSIS — E785 Hyperlipidemia, unspecified: Secondary | ICD-10-CM | POA: Diagnosis present

## 2014-01-11 DIAGNOSIS — E119 Type 2 diabetes mellitus without complications: Secondary | ICD-10-CM | POA: Diagnosis present

## 2014-01-11 DIAGNOSIS — A419 Sepsis, unspecified organism: Principal | ICD-10-CM

## 2014-01-11 DIAGNOSIS — I10 Essential (primary) hypertension: Secondary | ICD-10-CM

## 2014-01-11 DIAGNOSIS — E876 Hypokalemia: Secondary | ICD-10-CM | POA: Diagnosis present

## 2014-01-11 DIAGNOSIS — R319 Hematuria, unspecified: Secondary | ICD-10-CM

## 2014-01-11 DIAGNOSIS — A4151 Sepsis due to Escherichia coli [E. coli]: Secondary | ICD-10-CM | POA: Diagnosis present

## 2014-01-11 DIAGNOSIS — G92 Toxic encephalopathy: Secondary | ICD-10-CM | POA: Diagnosis present

## 2014-01-11 DIAGNOSIS — N189 Chronic kidney disease, unspecified: Secondary | ICD-10-CM

## 2014-01-11 DIAGNOSIS — F0391 Unspecified dementia with behavioral disturbance: Secondary | ICD-10-CM

## 2014-01-11 DIAGNOSIS — F03918 Unspecified dementia, unspecified severity, with other behavioral disturbance: Secondary | ICD-10-CM

## 2014-01-11 DIAGNOSIS — G9341 Metabolic encephalopathy: Secondary | ICD-10-CM

## 2014-01-11 DIAGNOSIS — E870 Hyperosmolality and hypernatremia: Secondary | ICD-10-CM

## 2014-01-11 DIAGNOSIS — N179 Acute kidney failure, unspecified: Secondary | ICD-10-CM

## 2014-01-11 DIAGNOSIS — G928 Other toxic encephalopathy: Secondary | ICD-10-CM

## 2014-01-11 LAB — URINE MICROSCOPIC-ADD ON

## 2014-01-11 LAB — CBC WITH DIFFERENTIAL/PLATELET
BASOS ABS: 0 10*3/uL (ref 0.0–0.1)
Basophils Relative: 0 % (ref 0–1)
EOS ABS: 0 10*3/uL (ref 0.0–0.7)
EOS PCT: 0 % (ref 0–5)
HEMATOCRIT: 44.7 % (ref 36.0–46.0)
Hemoglobin: 14.2 g/dL (ref 12.0–15.0)
LYMPHS PCT: 10 % — AB (ref 12–46)
Lymphs Abs: 1.2 10*3/uL (ref 0.7–4.0)
MCH: 30.1 pg (ref 26.0–34.0)
MCHC: 31.8 g/dL (ref 30.0–36.0)
MCV: 94.7 fL (ref 78.0–100.0)
MONO ABS: 1.3 10*3/uL — AB (ref 0.1–1.0)
Monocytes Relative: 10 % (ref 3–12)
Neutro Abs: 10.5 10*3/uL — ABNORMAL HIGH (ref 1.7–7.7)
Neutrophils Relative %: 80 % — ABNORMAL HIGH (ref 43–77)
PLATELETS: 169 10*3/uL (ref 150–400)
RBC: 4.72 MIL/uL (ref 3.87–5.11)
RDW: 13.8 % (ref 11.5–15.5)
WBC: 13.1 10*3/uL — AB (ref 4.0–10.5)

## 2014-01-11 LAB — URINALYSIS, ROUTINE W REFLEX MICROSCOPIC
Glucose, UA: NEGATIVE mg/dL
Ketones, ur: 15 mg/dL — AB
NITRITE: NEGATIVE
PROTEIN: NEGATIVE mg/dL
Specific Gravity, Urine: 1.021 (ref 1.005–1.030)
Urobilinogen, UA: 1 mg/dL (ref 0.0–1.0)
pH: 5 (ref 5.0–8.0)

## 2014-01-11 LAB — COMPREHENSIVE METABOLIC PANEL
ALT: 63 U/L — ABNORMAL HIGH (ref 0–35)
AST: 75 U/L — AB (ref 0–37)
Albumin: 2.4 g/dL — ABNORMAL LOW (ref 3.5–5.2)
Alkaline Phosphatase: 104 U/L (ref 39–117)
Anion gap: 15 (ref 5–15)
BUN: 118 mg/dL — ABNORMAL HIGH (ref 6–23)
CALCIUM: 8.5 mg/dL (ref 8.4–10.5)
CO2: 24 meq/L (ref 19–32)
CREATININE: 3.26 mg/dL — AB (ref 0.50–1.10)
Chloride: 115 mEq/L — ABNORMAL HIGH (ref 96–112)
GFR calc non Af Amer: 11 mL/min — ABNORMAL LOW (ref >90)
GFR, EST AFRICAN AMERICAN: 13 mL/min — AB (ref >90)
Glucose, Bld: 380 mg/dL — ABNORMAL HIGH (ref 70–99)
Potassium: 4 mEq/L (ref 3.7–5.3)
Sodium: 154 mEq/L — ABNORMAL HIGH (ref 137–147)
TOTAL PROTEIN: 6.2 g/dL (ref 6.0–8.3)
Total Bilirubin: 0.8 mg/dL (ref 0.3–1.2)

## 2014-01-11 LAB — GLUCOSE, CAPILLARY: Glucose-Capillary: 379 mg/dL — ABNORMAL HIGH (ref 70–99)

## 2014-01-11 LAB — TROPONIN I: Troponin I: <0.3 ng/mL (ref <0.30)

## 2014-01-11 LAB — I-STAT CG4 LACTIC ACID, ED: LACTIC ACID, VENOUS: 1.61 mmol/L (ref 0.5–2.2)

## 2014-01-11 LAB — MRSA PCR SCREENING: MRSA by PCR: NEGATIVE

## 2014-01-11 LAB — PROTIME-INR
INR: 1.25 (ref 0.00–1.49)
Prothrombin Time: 15.7 seconds — ABNORMAL HIGH (ref 11.6–15.2)

## 2014-01-11 MED ORDER — INSULIN DETEMIR 100 UNIT/ML ~~LOC~~ SOLN
25.0000 [IU] | Freq: Every day | SUBCUTANEOUS | Status: DC
  Administered 2014-01-13 – 2014-01-14 (×2): 25 [IU] via SUBCUTANEOUS
  Filled 2014-01-11 (×4): qty 0.25

## 2014-01-11 MED ORDER — ONDANSETRON HCL 4 MG/2ML IJ SOLN: 4.0000 mg | Freq: Four times a day (QID) | INTRAMUSCULAR | Status: DC | PRN

## 2014-01-11 MED ORDER — DEXTROSE 5 % IV SOLN
2.0000 g | Freq: Once | INTRAVENOUS | Status: AC
  Administered 2014-01-11: 2 g via INTRAVENOUS
  Filled 2014-01-11: qty 2

## 2014-01-11 MED ORDER — ACETAMINOPHEN 325 MG PO TABS: 650.0000 mg | ORAL_TABLET | Freq: Four times a day (QID) | ORAL | Status: DC | PRN

## 2014-01-11 MED ORDER — ENOXAPARIN SODIUM 30 MG/0.3ML ~~LOC~~ SOLN
30.0000 mg | SUBCUTANEOUS | Status: DC
  Administered 2014-01-11 – 2014-01-15 (×5): 30 mg via SUBCUTANEOUS
  Filled 2014-01-11 (×6): qty 0.3

## 2014-01-11 MED ORDER — SODIUM CHLORIDE 0.9 % IV BOLUS (SEPSIS)
500.0000 mL | Freq: Once | INTRAVENOUS | Status: AC
  Administered 2014-01-11: 500 mL via INTRAVENOUS

## 2014-01-11 MED ORDER — SODIUM CHLORIDE 0.9 % IV BOLUS (SEPSIS)
1000.0000 mL | Freq: Once | INTRAVENOUS | Status: AC
Start: 2014-01-11 — End: 2014-01-11
  Administered 2014-01-11: 1000 mL via INTRAVENOUS

## 2014-01-11 MED ORDER — INSULIN ASPART 100 UNIT/ML ~~LOC~~ SOLN
0.0000 [IU] | SUBCUTANEOUS | Status: DC
  Administered 2014-01-11: 15 [IU] via SUBCUTANEOUS
  Administered 2014-01-12: 5 [IU] via SUBCUTANEOUS
  Administered 2014-01-12: 15 [IU] via SUBCUTANEOUS
  Administered 2014-01-12: 3 [IU] via SUBCUTANEOUS
  Administered 2014-01-12: 2 [IU] via SUBCUTANEOUS
  Administered 2014-01-12: 3 [IU] via SUBCUTANEOUS
  Administered 2014-01-13: 8 [IU] via SUBCUTANEOUS
  Administered 2014-01-13: 5 [IU] via SUBCUTANEOUS
  Administered 2014-01-13: 3 [IU] via SUBCUTANEOUS

## 2014-01-11 MED ORDER — ONDANSETRON HCL 4 MG PO TABS: 4.0000 mg | ORAL_TABLET | Freq: Four times a day (QID) | ORAL | Status: DC | PRN

## 2014-01-11 MED ORDER — VANCOMYCIN HCL IN DEXTROSE 1-5 GM/200ML-% IV SOLN
1000.0000 mg | Freq: Once | INTRAVENOUS | Status: DC
  Filled 2014-01-11: qty 200

## 2014-01-11 MED ORDER — DEXTROSE 5 % IV SOLN
500.0000 mg | Freq: Three times a day (TID) | INTRAVENOUS | Status: DC
  Filled 2014-01-11 (×2): qty 0.5

## 2014-01-11 MED ORDER — ACETAMINOPHEN 650 MG RE SUPP: 650.0000 mg | Freq: Four times a day (QID) | RECTAL | Status: DC | PRN

## 2014-01-11 MED ORDER — DEXTROSE 5 % IV SOLN
1.0000 g | INTRAVENOUS | Status: DC
  Administered 2014-01-12 – 2014-01-15 (×4): 1 g via INTRAVENOUS
  Filled 2014-01-11 (×6): qty 10

## 2014-01-11 MED ORDER — SODIUM CHLORIDE 0.9 % IV SOLN
INTRAVENOUS | Status: DC
  Administered 2014-01-11: 21:00:00 via INTRAVENOUS

## 2014-01-11 MED ORDER — DEXTROSE 5 % IV SOLN
1.0000 g | Freq: Once | INTRAVENOUS | Status: AC
  Administered 2014-01-11: 1 g via INTRAVENOUS
  Filled 2014-01-11: qty 10

## 2014-01-11 MED ORDER — VANCOMYCIN HCL IN DEXTROSE 1-5 GM/200ML-% IV SOLN
1000.0000 mg | INTRAVENOUS | Status: DC
  Administered 2014-01-11: 1000 mg via INTRAVENOUS

## 2014-01-11 NOTE — ED Notes (Signed)
MD at bedside. 

## 2014-01-11 NOTE — Progress Notes (Addendum)
ANTIBIOTIC CONSULT NOTE - INITIAL  Pharmacy Consult for aztreonam and vancomycin Indication: HCAP  Allergies  Allergen Reactions  . Penicillins Other (See Comments)    Per [facility]    Patient Measurements:    Vital Signs: Temp: 102.2 F (39 C) (09/24 1354) Temp src: Rectal (09/24 1354) BP: 138/115 mmHg (09/24 1354) Pulse Rate: 97 (09/24 1354) Intake/Output from previous day:   Intake/Output from this shift: Total I/O In: -  Out: 1 [Stool:1]  Labs: No results found for this basename: WBC, HGB, PLT, LABCREA, CREATININE,  in the last 72 hours The CrCl is unknown because both a height and weight (above a minimum accepted value) are required for this calculation. No results found for this basename: VANCOTROUGH, VANCOPEAK, VANCORANDOM, GENTTROUGH, GENTPEAK, GENTRANDOM, TOBRATROUGH, TOBRAPEAK, TOBRARND, AMIKACINPEAK, AMIKACINTROU, AMIKACIN,  in the last 72 hours   Microbiology: No results found for this or any previous visit (from the past 720 hour(s)).  Medical History: Past Medical History  Diagnosis Date  . 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 Esophageal reflux   . Osteoporosis, unspecified   . Type II or unspecified type diabetes mellitus with ophthalmic manifestations, not stated as uncontrolled   . Chronic kidney disease, unspecified   . Other and unspecified hyperlipidemia   . Type II or unspecified type diabetes mellitus without mention of complication, not stated as uncontrolled   . Mitral valve insufficiency and aortic valve insufficiency   . Acute myocardial infarction, unspecified site, episode of care unspecified   . Atrial fibrillation   . Peptic ulcer, unspecified site, unspecified as acute or chronic, without mention of hemorrhage, perforation, or obstruction   . 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 Esophageal reflux   . Osteoporosis, unspecified   . Type II or unspecified type  diabetes mellitus with ophthalmic manifestations, not stated as uncontrolled   . Chronic kidney disease, unspecified   . Type II or unspecified type diabetes mellitus without mention of complication, not stated as uncontrolled   . Mitral valve insufficiency and aortic valve insufficiency   . Acute myocardial infarction, unspecified site, episode of care unspecified   . Atrial fibrillation   . Hypertension   . Renal insufficiency   . Stasis dermatitis 2010    Dr. Terri Piedra     Assessment: 80 yo F from [facility] with fever 102 starting yesterday.   Pt with increasing lethargy, responsive only to painful stimuli.  No abx PTA.  BP 88/40. Temp 101.2  Creat 3.25, creat cl ~ 11.5 ml/min;  WBC 13.1  Per daughter she was weighed yesterday 9/23 at SNF and wt = 155 lb = 70.3 kg.  CXR 9/24: negative.   Aztreonam 9/24>> vanco 9/24>> Ceftriaxone ordered in ED 9/24>>  9/24 Ucx>> 9/24 BCX x2>>   Goal of Therapy:  Vancomycin trough level 15-20 mcg/ml  Plan:  -aztreonam 2 gm given in ED at 1415, then aztreonam 500 mg IV q8h -vancomycin 1 mg ordered in ED, not yet given, then vancomycin 1000 mg IV q48 -f/u renal fxn, wbc, temp, culture data -steady-state vancomycin trough as needed  Herby Abraham, Pharm.D. [phone] 01/11/2014 3:18 PM

## 2014-01-11 NOTE — ED Notes (Signed)
Family at bedside. 

## 2014-01-11 NOTE — ED Notes (Signed)
Ms. [name], the patient's daughter is at the bedside.

## 2014-01-11 NOTE — H&P (Signed)
[facility] History and Physical  [name] ZOX:[id number] DOB: 15-Jan-1917 DOA: 01/11/2014  Referring physician: EDP PCP: Sonda Primes, MD   Chief Complaint: unresponsive  HPI: [name] is a 80 y.o. female with PMH of Advanced Dementia recently moved to [facility] from ALF due to need for higher level of care, she also has h/o of DM on Insulin, CKD 2, HTN. Per Daughter at baseline, she is alert and mumbles yes or no but does not talk otherwise, cannot recognize any family. Today she was sent from SNF due to decreased responsiveness compared to baseline, temp of 102. EMS was called her initial BP was 88/40 which improved some with IVF. No other reliable history available at this time. In ER, BP was low and improved with IVF bolus to 103/43, also noted to be tachypneic and febrile. Labs revealed, hypernatremia, AKI, Elevated BUN, leukocytosis and abnormal UA with numerous WBCs and many bacteria.   Review of Systems: unable to obtain due to dementia Constitutional:  No weight loss, night sweats, Fevers, chills, fatigue.  HEENT:  No headaches, Difficulty swallowing,Tooth/dental problems,Sore throat,  No sneezing, itching, ear ache, nasal congestion, post nasal drip,  Cardio-vascular:  No chest pain, Orthopnea, PND, swelling in lower extremities, anasarca, dizziness, palpitations  GI:  No heartburn, indigestion, abdominal pain, nausea, vomiting, diarrhea, change in bowel habits, loss of appetite  Resp:  No shortness of breath with exertion or at rest. No excess mucus, no productive cough, No non-productive cough, No coughing up of blood.No change in color of mucus.No wheezing.No chest wall deformity  Skin:  no rash or lesions.  GU:  no dysuria, change in color of urine, no urgency or frequency. No flank pain.  Musculoskeletal:  No joint pain or swelling. No decreased range of motion. No back pain.  Psych:  No change in mood or affect. No depression or  anxiety. No memory loss.   Past Medical History  Diagnosis Date  . 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 Esophageal reflux   . Osteoporosis, unspecified   . Type II or unspecified type diabetes mellitus with ophthalmic manifestations, not stated as uncontrolled   . Chronic kidney disease, unspecified   . Other and unspecified hyperlipidemia   . Type II or unspecified type diabetes mellitus without mention of complication, not stated as uncontrolled   . Mitral valve insufficiency and aortic valve insufficiency   . Acute myocardial infarction, unspecified site, episode of care unspecified   . Atrial fibrillation   . Peptic ulcer, unspecified site, unspecified as acute or chronic, without mention of hemorrhage, perforation, or obstruction   . 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 Esophageal reflux   . Osteoporosis, unspecified   . Type II or unspecified type diabetes mellitus with ophthalmic manifestations, not stated as uncontrolled   . Chronic kidney disease, unspecified   . Type II or unspecified type diabetes mellitus without mention of complication, not stated as uncontrolled   . Mitral valve insufficiency and aortic valve insufficiency   . Acute myocardial infarction, unspecified site, episode of care unspecified   . Atrial fibrillation   . Hypertension   . Renal insufficiency   . Stasis dermatitis 2010    Dr. Terri Piedra   History reviewed. No pertinent past surgical history. Social History:  reports that she has never smoked. She does not have any smokeless tobacco history on file. She reports that she does not drink alcohol or  use illicit drugs.  Allergies  Allergen Reactions  . Penicillins Other (See Comments)    Per [facility]    Family History  Problem Relation Age of Onset  . Hypertension Other   . Hypertension Mother   . Diabetes Mother   . Hypertension Father      Prior to Admission  medications   Medication Sig Start Date End Date Taking? Authorizing Provider  acetaminophen (TYLENOL) 650 MG suppository Place 650 mg rectally every 6 (six) hours as needed (for elevated temperature). For 2 days starting 9/23   Yes Historical Provider, MD  aspirin 81 MG tablet Take 81 mg by mouth daily.     Yes Historical Provider, MD  cholecalciferol (VITAMIN D) 400 UNITS TABS Take 400 Units by mouth every morning.   Yes Historical Provider, MD  docusate sodium (COLACE) 100 MG capsule Take 100 mg by mouth daily. For constipation   Yes Historical Provider, MD  donepezil (ARICEPT) 5 MG tablet Take 5 mg by mouth at bedtime.   Yes Historical Provider, MD  Fiber POWD Take by mouth daily. Give 2 teaspoonfuls once a day   Yes Historical Provider, MD  furosemide (LASIX) 20 MG tablet Take 20 mg by mouth.   Yes Historical Provider, MD  insulin detemir (LEVEMIR) 100 UNIT/ML injection Inject 32 Units into the skin daily. In morning   Yes Historical Provider, MD  insulin lispro (HUMALOG KWIKPEN) 100 UNIT/ML SOPN Inject 4-5 Units into the skin See admin instructions. Give 4 units twice daily. HOLD IF BS <200 or does not eat Give extra coverage sliding scale, in addition to 4 units, twice a day: 0-300=0; 301-500=5   Yes Historical Provider, MD  lisinopril (PRINIVIL,ZESTRIL) 2.5 MG tablet Take 2.5 mg by mouth daily.   Yes Historical Provider, MD  multivitamin-lutein (OCUVITE-LUTEIN) CAPS Take 1 capsule by mouth 2 (two) times daily with a meal.   Yes Historical Provider, MD  omeprazole (PRILOSEC) 20 MG capsule Take 20 mg by mouth daily.   Yes Historical Provider, MD  pravastatin (PRAVACHOL) 20 MG tablet Take 20 mg by mouth at bedtime.   Yes Historical Provider, MD  senna (SENOKOT) 8.6 MG TABS tablet Take 2 tablets by mouth 2 (two) times daily as needed (for constipation).   Yes Historical Provider, MD  vitamin B-12 (CYANOCOBALAMIN) 1000 MCG tablet Take 1,000 mcg by mouth every other day.    Yes Historical  Provider, MD   Physical Exam: Filed Vitals:   01/11/14 1515 01/11/14 1530 01/11/14 1545 01/11/14 1600  BP: 111/39 115/40 103/39 100/40  Pulse: 85 95 81 95  Temp:      TempSrc:      Resp: Weight:      SpO2: 96% 97% 97% 97%    Wt Readings from Last 3 Encounters:  01/11/14 70.308 kg (155 lb)  01/12/11 72.576 kg (160 lb)  10/08/10 71.215 kg (157 lb)    General:  Obtunded, withdraws to painful stimuli, mouth open, dry mucous membranes Eyes: pupils sluggish ENT: dry lips & tongue Neck: no LAD, masses or thyromegaly Cardiovascular: RRR, no m/r/g. No LE edema. Respiratory: CTA bilaterally, no w/r/r. Normal respiratory effort. Abdomen: soft, ntnd, BS present Skin: no rash or induration seen on limited exam Musculoskeletal: both hips and knees flexed and resists any movement to check RoM and grimaces Psychiatric: unable to assess Neurologic: obtunded          Labs on Admission:  Basic Metabolic Panel:  Recent Labs Lab 01/11/14  1325  NA 154*  K 4.0  CL 115*  CO2 24  GLUCOSE 380*  BUN 118*  CREATININE 3.26*  CALCIUM 8.5   Liver Function Tests:  Recent Labs Lab 01/11/14 1325  AST 75*  ALT 63*  ALKPHOS 104  BILITOT 0.8  PROT 6.2  ALBUMIN 2.4*   No results found for this basename: LIPASE, AMYLASE,  in the last 168 hours No results found for this basename: AMMONIA,  in the last 168 hours CBC:  Recent Labs Lab 01/11/14 1325  WBC 13.1*  NEUTROABS 10.5*  HGB 14.2  HCT 44.7  MCV 94.7  PLT 169   Cardiac Enzymes:  Recent Labs Lab 01/11/14 1325  TROPONINI <0.30    BNP (last 3 results) No results found for this basename: PROBNP,  in the last 8760 hours CBG: No results found for this basename: GLUCAP,  in the last 168 hours  Radiological Exams on Admission: Dg Chest Port 1 View  01/11/2014   CLINICAL DATA:  Fever and altered mental status.  EXAM: PORTABLE CHEST - 1 VIEW  COMPARISON:  PA and lateral chest 04/17/2013. Single view of the  chest 04/19/2013.  FINDINGS: The lungs are clear. Heart size is normal. No pneumothorax or pleural effusion.  IMPRESSION: Negative chest.   Electronically Signed   By: Drusilla Kanner M.D.   On: 01/11/2014 14:26    Assessment/Plan Principal Problem:   Sepsis -source appears to be urine -IVF, IV Ceftriaxone-has tolerated cephalosporins in past and already got a dose in ER -FU Blood and urine Cx -discussed with daughter June Lawson at bedside, she is agreeable to conservative management only. -NO PRESSORs, No Central Line, and DNR -if does not respond to conservative mgt with IVF, Abx then she is agreeable to consider Comfort care    UTI -as above    Acute Kidney injury -due to Sepsis, ATN -last baseline creatinine of 1.2 in 12/14 -check Renal US  -not HD candidate in event of worsening    Toxic metabolic encephalopathy -due to Sepsis, AKI, Uremia in the background of Advanced Dementia -hold all PO meds and NPO till mentation improves    DIABETES MELLITUS, TYPE II -resume Levemir -SSI Q4   Hypernatremia -due to Sepsis and soft BP will hydrate with NS for now, can change to 1/2 NS later in course, follow Bmet   Advanced DEMENTIA -hold aricept   Hip pain and grimacing with movt -check Xrays of Hip  Code Status: DNR DVT Prophylaxis: lovenox Family Communication: d/w daughter June Lawson at bedside Disposition Plan: admit to SDU  Time spent:  [facility] Pager [phone]

## 2014-01-11 NOTE — ED Provider Notes (Signed)
CSN: [id number]     Arrival date & time 01/11/14  1319 History   First MD Initiated Contact with Patient 01/11/14 1320     Chief Complaint  Patient presents with  . Fever  . Altered Mental Status     (Consider location/radiation/quality/duration/timing/severity/associated sxs/prior Treatment) HPI Comments: 80 yo female with   Patient is a 80 y.o. female presenting with altered mental status.  Altered Mental Status Presenting symptoms: partial responsiveness   Severity:  Severe Most recent episode:  Today Episode history:  Continuous Timing:  Constant Progression:  Unable to specify Chronicity:  New Context: dementia and nursing home resident   Associated symptoms: fever   Associated symptoms comment:  Cough   Past Medical History  Diagnosis Date  . 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 Esophageal reflux   . Osteoporosis, unspecified   . Type II or unspecified type diabetes mellitus with ophthalmic manifestations, not stated as uncontrolled   . Chronic kidney disease, unspecified   . Other and unspecified hyperlipidemia   . Type II or unspecified type diabetes mellitus without mention of complication, not stated as uncontrolled   . Mitral valve insufficiency and aortic valve insufficiency   . Acute myocardial infarction, unspecified site, episode of care unspecified   . Atrial fibrillation   . Peptic ulcer, unspecified site, unspecified as acute or chronic, without mention of hemorrhage, perforation, or obstruction   . 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 Esophageal reflux   . Osteoporosis, unspecified   . Type II or unspecified type diabetes mellitus with ophthalmic manifestations, not stated as uncontrolled   . Chronic kidney disease, unspecified   . Type II or unspecified type diabetes mellitus without mention of complication, not stated as uncontrolled   . Mitral valve insufficiency and  aortic valve insufficiency   . Acute myocardial infarction, unspecified site, episode of care unspecified   . Atrial fibrillation   . Hypertension   . Renal insufficiency   . Stasis dermatitis 2010    Dr. Terri Piedra   No past surgical history on file. Family History  Problem Relation Age of Onset  . Hypertension Other   . Hypertension Mother   . Diabetes Mother   . Hypertension Father    History  Substance Use Topics  . Smoking status: Never Smoker   . Smokeless tobacco: Not on file  . Alcohol Use: No   OB History   Grav Para Term Preterm Abortions TAB SAB Ect Mult Living                 Review of Systems  Unable to perform ROS: Mental status change  Constitutional: Positive for fever.      Allergies  Penicillins  Home Medications   Prior to Admission medications   Medication Sig Start Date End Date Taking? Authorizing Provider  aspirin 81 MG tablet Take 81 mg by mouth daily.      Historical Provider, MD  cephALEXin (KEFLEX) 500 MG capsule Take 1 capsule (500 mg total) by mouth 3 (three) times daily. 04/20/13   Garlon Hatchet, PA-C  cholecalciferol (VITAMIN D) 400 UNITS TABS Take 400 Units by mouth every morning.    Historical Provider, MD  docusate sodium (COLACE) 100 MG capsule Take 100 mg by mouth daily. For constipation    Historical Provider, MD  esomeprazole (NEXIUM) 40 MG capsule Take 40 mg by mouth every morning.    Historical Provider, MD  furosemide (  LASIX) 20 MG tablet Take 20 mg by mouth.    Historical Provider, MD  galantamine (RAZADYNE) 12 MG tablet Take 12 mg by mouth 2 (two) times daily.    Historical Provider, MD  insulin detemir (LEVEMIR) 100 UNIT/ML injection Inject 30 Units into the skin daily. In morning    Historical Provider, MD  insulin lispro (HUMALOG KWIKPEN) 100 UNIT/ML KiwkPen Inject 3 Units into the skin 3 (three) times daily before meals. Hold for BS <200 or patient does not eat. 04/20/13   Garlon Hatchet, PA-C  insulin lispro (HUMALOG KWIKPEN)  100 UNIT/ML SOPN Inject 3 Units into the skin 2 (two) times daily. HOLD IF BS <200 or does not eat    Historical Provider, MD  levofloxacin (LEVAQUIN) 750 MG tablet Take 1 tablet (750 mg total) by mouth daily. 04/17/13   Elwin Mocha, MD  lisinopril (PRINIVIL,ZESTRIL) 2.5 MG tablet Take 2.5 mg by mouth daily.    Historical Provider, MD  multivitamin-lutein (OCUVITE-LUTEIN) CAPS Take 1 capsule by mouth 2 (two) times daily with a meal.    Historical Provider, MD  nystatin cream (MYCOSTATIN) Apply 1 application topically 2 (two) times daily. Both breast    Historical Provider, MD  pravastatin (PRAVACHOL) 20 MG tablet Take 20 mg by mouth at bedtime.    Historical Provider, MD  senna (SENOKOT) 8.6 MG TABS tablet Take 2 tablets by mouth 2 (two) times daily as needed (for constipation).    Historical Provider, MD  vitamin B-12 (CYANOCOBALAMIN) 1000 MCG tablet Take 1,000 mcg by mouth every other day.     Historical Provider, MD  zinc oxide 20 % ointment Apply 1 application topically 2 (two) times daily as needed for dry skin.    Historical Provider, MD   SpO2 96% Physical Exam  Nursing note and vitals reviewed. Constitutional: She appears well-developed and well-nourished. No distress.  HENT:  Head: Normocephalic and atraumatic.  Mouth/Throat: Oropharynx is clear and moist.  Eyes: Conjunctivae are normal. Pupils are equal, round, and reactive to light. No scleral icterus.  Neck: Neck supple.  Cardiovascular: Normal rate, regular rhythm, normal heart sounds and intact distal pulses.   No murmur heard. Pulmonary/Chest: Effort normal and breath sounds normal. No stridor. No respiratory distress. She has no rales.  Abdominal: Soft. Bowel sounds are normal. She exhibits no distension. There is no tenderness.  Musculoskeletal: Normal range of motion.  Neurological: She is unresponsive. GCS eye subscore is 1. GCS verbal subscore is 2. GCS motor subscore is 5.  Skin: Skin is warm and dry. No rash noted.   Psychiatric:  Unable to test    ED Course  CRITICAL CARE Performed by: Blake Divine DAVID III Authorized by: Blake Divine DAVID III Total critical care time: 35 minutes Critical care time was exclusive of separately billable procedures and treating other patients. Critical care was necessary to treat or prevent imminent or life-threatening deterioration of the following conditions: metabolic crisis, dehydration, sepsis, renal failure and CNS failure or compromise. Critical care was time spent personally by me on the following activities: development of treatment plan with patient or surrogate, discussions with consultants, evaluation of patient's response to treatment, examination of patient, obtaining history from patient or surrogate, ordering and performing treatments and interventions, ordering and review of laboratory studies, ordering and review of radiographic studies, pulse oximetry, re-evaluation of patient's condition and review of old charts.   (including critical care time) Labs Review Labs Reviewed  CBC WITH DIFFERENTIAL - Abnormal; Notable for the following:  WBC 13.1 (*)    Neutrophils Relative % 80 (*)    Neutro Abs 10.5 (*)    Lymphocytes Relative 10 (*)    Monocytes Absolute 1.3 (*)    All other components within normal limits  COMPREHENSIVE METABOLIC PANEL - Abnormal; Notable for the following:    Sodium 154 (*)    Chloride 115 (*)    Glucose, Bld 380 (*)    BUN 118 (*)    Creatinine, Ser 3.26 (*)    Albumin 2.4 (*)    AST 75 (*)    ALT 63 (*)    GFR calc non Af Amer 11 (*)    GFR calc Af Amer 13 (*)    All other components within normal limits  URINALYSIS, ROUTINE W REFLEX MICROSCOPIC - Abnormal; Notable for the following:    Color, Urine AMBER (*)    APPearance CLOUDY (*)    Hgb urine dipstick TRACE (*)    Bilirubin Urine SMALL (*)    Ketones, ur 15 (*)    Leukocytes, UA LARGE (*)    All other components within normal limits  PROTIME-INR -  Abnormal; Notable for the following:    Prothrombin Time 15.7 (*)    All other components within normal limits  URINE MICROSCOPIC-ADD ON - Abnormal; Notable for the following:    Squamous Epithelial / LPF FEW (*)    Bacteria, UA MANY (*)    All other components within normal limits  CBC - Abnormal; Notable for the following:    WBC 14.4 (*)    All other components within normal limits  COMPREHENSIVE METABOLIC PANEL - Abnormal; Notable for the following:    Sodium 165 (*)    Potassium 3.5 (*)    Chloride 128 (*)    Glucose, Bld 143 (*)    BUN 109 (*)    Creatinine, Ser 2.42 (*)    Total Protein 5.8 (*)    Albumin 2.2 (*)    AST 40 (*)    ALT 52 (*)    GFR calc non Af Amer 16 (*)    GFR calc Af Amer 18 (*)    All other components within normal limits  GLUCOSE, CAPILLARY - Abnormal; Notable for the following:    Glucose-Capillary 379 (*)    All other components within normal limits  GLUCOSE, CAPILLARY - Abnormal; Notable for the following:    Glucose-Capillary 382 (*)    All other components within normal limits  GLUCOSE, CAPILLARY - Abnormal; Notable for the following:    Glucose-Capillary 126 (*)    All other components within normal limits  MRSA PCR SCREENING  CULTURE, BLOOD (ROUTINE X 2)  CULTURE, BLOOD (ROUTINE X 2)  URINE CULTURE  TROPONIN I  LACTIC ACID, PLASMA  BASIC METABOLIC PANEL  I-STAT CG4 LACTIC ACID, ED    Imaging Review  Dg Chest Port 1 View  01/11/2014   CLINICAL DATA:  Fever and altered mental status.  EXAM: PORTABLE CHEST - 1 VIEW  COMPARISON:  PA and lateral chest 04/17/2013. Single view of the chest 04/19/2013.  FINDINGS: The lungs are clear. Heart size is normal. No pneumothorax or pleural effusion.  IMPRESSION:  Negative chest.   Electronically Signed   By: Drusilla Kanner M.D.   On: 01/11/2014 14:26   All radiology studies independently viewed by me.      EKG Interpretation None      MDM   Final diagnoses:  Sepsis, due to unspecified  organism  AKI (  acute kidney injury)  Urinary tract infection with hematuria, site unspecified  Hypernatremia    80 yo female with hx of dementia presenting with fever and decreased responsiveness.  Initially suspected fever secondary to pneumonia (? Aspiration), but workup more consistent with UTI.  Empirically treated with IV abx. I spoke with her daughter concerning her code status/advanced directives.  She told me that pt would not want CPR or intubation.  At that time, she wanted to discuss possibility of pressors with other family.  Pt's BPs were low, improved somewhat with IV fluids.  Labs showed acute kidney injury and hypernatremia, suspected from sepsis, hypovolemia.  Consulted internal medicine for admission to stepdown unit.      Candyce Churn III, MD 01/12/14 [phone]

## 2014-01-11 NOTE — ED Notes (Signed)
Pt from [facility] via [facility] with c/o fever 102 starting yesterday.  Staff reports pt has continued a fever and had increasing lethargy.  Pt responsive only to painful stimuli.  BP on arrival 88/40 given 250 mL NS, last BO 97/55.  Pt normally on 2L nasal cannula.  Pt has a congested cough.  EKG shows RBBB.  Given rectal tylenol by staff at SNF.  Dr Loretha Stapler at bedside 1315.

## 2014-01-11 NOTE — ED Notes (Signed)
Her granddaughter Clydie Braun, provided me with her phone number [phone].

## 2014-01-11 NOTE — ED Notes (Signed)
The patient does have redness, and blanchable skin on the sacrum. It was present upon admission to the ED.

## 2014-01-12 DIAGNOSIS — N189 Chronic kidney disease, unspecified: Secondary | ICD-10-CM

## 2014-01-12 DIAGNOSIS — G92 Toxic encephalopathy: Secondary | ICD-10-CM

## 2014-01-12 DIAGNOSIS — E119 Type 2 diabetes mellitus without complications: Secondary | ICD-10-CM

## 2014-01-12 DIAGNOSIS — N179 Acute kidney failure, unspecified: Secondary | ICD-10-CM

## 2014-01-12 DIAGNOSIS — E870 Hyperosmolality and hypernatremia: Secondary | ICD-10-CM

## 2014-01-12 DIAGNOSIS — I10 Essential (primary) hypertension: Secondary | ICD-10-CM

## 2014-01-12 DIAGNOSIS — A419 Sepsis, unspecified organism: Principal | ICD-10-CM

## 2014-01-12 DIAGNOSIS — G929 Unspecified toxic encephalopathy: Secondary | ICD-10-CM

## 2014-01-12 LAB — GLUCOSE, CAPILLARY
GLUCOSE-CAPILLARY: 126 mg/dL — AB (ref 70–99)
GLUCOSE-CAPILLARY: 190 mg/dL — AB (ref 70–99)
Glucose-Capillary: 145 mg/dL — ABNORMAL HIGH (ref 70–99)
Glucose-Capillary: 155 mg/dL — ABNORMAL HIGH (ref 70–99)
Glucose-Capillary: 188 mg/dL — ABNORMAL HIGH (ref 70–99)
Glucose-Capillary: 197 mg/dL — ABNORMAL HIGH (ref 70–99)
Glucose-Capillary: 35 mg/dL — CL (ref 70–99)
Glucose-Capillary: 382 mg/dL — ABNORMAL HIGH (ref 70–99)
Glucose-Capillary: 92 mg/dL (ref 70–99)

## 2014-01-12 LAB — COMPREHENSIVE METABOLIC PANEL
ALK PHOS: 96 U/L (ref 39–117)
ALT: 52 U/L — ABNORMAL HIGH (ref 0–35)
ALT: 40 U/L — AB (ref 0–35)
AST: 40 U/L — AB (ref 0–37)
AST: 32 U/L (ref 0–37)
Albumin: 2.2 g/dL — ABNORMAL LOW (ref 3.5–5.2)
Albumin: 2 g/dL — ABNORMAL LOW (ref 3.5–5.2)
Alkaline Phosphatase: 80 U/L (ref 39–117)
Anion gap: 13 (ref 5–15)
Anion gap: 9 (ref 5–15)
BUN: 109 mg/dL — ABNORMAL HIGH (ref 6–23)
BUN: 88 mg/dL — AB (ref 6–23)
CALCIUM: 7.9 mg/dL — AB (ref 8.4–10.5)
CHLORIDE: 128 meq/L — AB (ref 96–112)
CO2: 24 meq/L (ref 19–32)
CO2: 23 meq/L (ref 19–32)
Calcium: 8.4 mg/dL (ref 8.4–10.5)
Chloride: 127 mEq/L — ABNORMAL HIGH (ref 96–112)
Creatinine, Ser: 2.42 mg/dL — ABNORMAL HIGH (ref 0.50–1.10)
Creatinine, Ser: 1.85 mg/dL — ABNORMAL HIGH (ref 0.50–1.10)
GFR calc Af Amer: 18 mL/min — ABNORMAL LOW (ref >90)
GFR calc non Af Amer: 16 mL/min — ABNORMAL LOW (ref >90)
GFR, EST AFRICAN AMERICAN: 26 mL/min — AB (ref >90)
GFR, EST NON AFRICAN AMERICAN: 22 mL/min — AB (ref >90)
GLUCOSE: 143 mg/dL — AB (ref 70–99)
GLUCOSE: 158 mg/dL — AB (ref 70–99)
Potassium: 3.5 mEq/L — ABNORMAL LOW (ref 3.7–5.3)
Potassium: 3.8 mEq/L (ref 3.7–5.3)
SODIUM: 159 meq/L — AB (ref 137–147)
Sodium: 165 mEq/L (ref 137–147)
Total Bilirubin: 0.5 mg/dL (ref 0.3–1.2)
Total Bilirubin: 0.5 mg/dL (ref 0.3–1.2)
Total Protein: 5.8 g/dL — ABNORMAL LOW (ref 6.0–8.3)
Total Protein: 5 g/dL — ABNORMAL LOW (ref 6.0–8.3)

## 2014-01-12 LAB — CBC
HCT: 40.7 % (ref 36.0–46.0)
HEMOGLOBIN: 12.8 g/dL (ref 12.0–15.0)
MCH: 29.8 pg (ref 26.0–34.0)
MCHC: 31.4 g/dL (ref 30.0–36.0)
MCV: 94.7 fL (ref 78.0–100.0)
PLATELETS: 163 10*3/uL (ref 150–400)
RBC: 4.3 MIL/uL (ref 3.87–5.11)
RDW: 13.6 % (ref 11.5–15.5)
WBC: 14.4 10*3/uL — AB (ref 4.0–10.5)

## 2014-01-12 LAB — MAGNESIUM: Magnesium: 2.7 mg/dL — ABNORMAL HIGH (ref 1.5–2.5)

## 2014-01-12 LAB — LACTIC ACID, PLASMA: Lactic Acid, Venous: 1.7 mmol/L (ref 0.5–2.2)

## 2014-01-12 MED ORDER — DEXTROSE 5 % IV SOLN
INTRAVENOUS | Status: DC
  Administered 2014-01-12: 125 mL via INTRAVENOUS
  Administered 2014-01-13 – 2014-01-15 (×4): via INTRAVENOUS
  Administered 2014-01-15: 50 mL via INTRAVENOUS

## 2014-01-12 MED ORDER — CETYLPYRIDINIUM CHLORIDE 0.05 % MT LIQD
7.0000 mL | Freq: Two times a day (BID) | OROMUCOSAL | Status: DC
  Administered 2014-01-12 – 2014-01-17 (×12): 7 mL via OROMUCOSAL

## 2014-01-12 MED ORDER — DEXTROSE 50 % IV SOLN
INTRAVENOUS | Status: AC
  Administered 2014-01-12: 50 mL via INTRAVASCULAR
  Filled 2014-01-12: qty 50

## 2014-01-12 MED ORDER — SODIUM CHLORIDE 0.45 % IV SOLN
INTRAVENOUS | Status: DC
  Administered 2014-01-12: 05:00:00 via INTRAVENOUS

## 2014-01-12 MED ORDER — SODIUM CHLORIDE 0.9 % IV BOLUS (SEPSIS)
1000.0000 mL | Freq: Once | INTRAVENOUS | Status: AC
  Administered 2014-01-12: 1000 mL via INTRAVENOUS

## 2014-01-12 MED ORDER — POTASSIUM CHLORIDE 10 MEQ/100ML IV SOLN
10.0000 meq | INTRAVENOUS | Status: AC
  Administered 2014-01-12 (×2): 10 meq via INTRAVENOUS
  Filled 2014-01-12 (×2): qty 100

## 2014-01-12 NOTE — Progress Notes (Signed)
Inpatient Diabetes Program Recommendations  AACE/ADA: New Consensus Statement on Inpatient Glycemic Control (2013)  Target Ranges:  Prepandial:   less than 140 mg/dL      Peak postprandial:   less than 180 mg/dL (1-2 hours)      Critically ill patients:  140 - 180 mg/dL   Inpatient Diabetes Program Recommendations Correction (SSI): consider changing to Q6 Novolog coverage   Note: pt with Hypoglycemia this morning.  Thank you  Piedad Climes BSN, RN,CDE Inpatient Diabetes Coordinator [phone] (team pager)

## 2014-01-12 NOTE — Progress Notes (Signed)
CRITICAL VALUE ALERT  Critical value received: sodium 165  Date of notification:  01/12/14  Time of notification:  0452  Critical value read back: yes  Nurse who received alert:  Jacklyn Shell RN  MD notified (1st page):  Maren Reamer NP  Time of first page:  0508  MD notified (2nd page):  Time of second page:  Responding MD:  Maren Reamer NP  Time MD responded:  [phone]

## 2014-01-12 NOTE — Care Management Note (Addendum)
    Page 1 of 1   01/17/2014     3:51:05 PM CARE MANAGEMENT NOTE 01/17/2014  Patient:  VIANNE, GRIESHOP   Account Number:  [id number]  Date Initiated:  01/12/2014  Documentation initiated by:  Junius Creamer  Subjective/Objective Assessment:   adm w sepsis, hx dementia     Action/Plan:   from nsg facility  pcp dr Posey Rea   Anticipated DC Date:  01/17/2014   Anticipated DC Plan:  [facility]  In-house referral  Clinical Social Worker      DC Planning Services  CM consult      Choice offered to / List presented to:             Status of service:  Completed, signed off Medicare Important Message given?  YES (If response is "NO", the following Medicare IM given date fields will be blank) Date Medicare IM given:  01/15/2014 Medicare IM given by:  Junius Creamer Date Additional Medicare IM given:   Additional Medicare IM given by:    Discharge Disposition:  PSYCHIATRIC HOSPITAL  Per UR Regulation:  Reviewed for med. necessity/level of care/duration of stay  If discussed at Long Length of Stay Meetings, dates discussed:   01/16/2014    Comments:  01/17/14 1550 Letha Cape RN, BSN [phone] patient dc to [facility] today, CSW faciltated.

## 2014-01-12 NOTE — Progress Notes (Signed)
[place] TEAM 1 - Stepdown/ICU TEAM Progress Note  [name] ZOX:[id number] DOB: Apr 17, 1917 DOA: 01/11/2014 PCP: Sonda Primes, MD  Admit HPI / Brief Narrative: [name] is a 80 y.o. WF  PMHx  Advanced Dementia (per family at baseline cannot communicate needs) recently moved to [facility] from ALF due to need for higher level of care, Hx of DM type II on Insulin, CKD stage 2, HTN.  Per Daughter at baseline, she is alert and mumbles yes or no but does not talk otherwise, cannot recognize any family.  Today she was sent from SNF due to decreased responsiveness compared to baseline, temp of 102.  EMS was called her initial BP was 88/40 which improved some with IVF.  No other reliable history available at this time.  In ER, BP was low and improved with IVF bolus to 103/43, also noted to be tachypneic and febrile.  Labs revealed, hypernatremia, AKI, Elevated BUN, leukocytosis and abnormal UA with numerous WBCs and many bacteria.      HPI/Subjective: 9/25 patient noncommunicative, does moan with painful stimuli such as rolling patient over to examine back. (Per family this is baseline)  Assessment/Plan: Sepsis  -source appears to be urine  -Continue D5W at 125 ml/hr  -Continue Ceftriaxone  -Blood and urine Cx pending -discussed at length with (daughter) [name] , and [name] Granddaughter at bedside, she is agreeable to conservative management only.  -NO PRESSORs, No Central Line, and DNR  -if does not respond to conservative mgt with IVF, Abx then she is agreeable to consider Comfort care -Strict in and out -Daily weights   UTI  -as above   Acute Kidney injury  -due to Sepsis, ATN  -last baseline creatinine of 1.2 in 12/14  -Renal US; no hydronephrosis medical renal disease   Toxic metabolic encephalopathy  -Multifactorial to include Sepsis, AKI, Uremia in the background of Advanced Dementia  -hold all PO meds and NPO till mentation  improves   DIABETES MELLITUS, TYPE II  -resume Levemir 25 units daily -Moderate SSI    Hypernatremia  - Continue D5 W. @ 111ml/hr  - Check CMP and Mg @ 1600   Advanced DEMENTIA  -hold aricept   Hip pain and grimacing with movt  -Xrays of Hip; DJD    Code Status: DNR Family Communication: no family present at time of exam Disposition Plan: Resolution of sepsis    Consultants: NA   Procedure/Significant Events: 9/24 Bilat Hip X-ray; Degenerative joint changes of bilateral hips. No acute fracture or  dislocation 9/24 renal ultrasound; medical renal disease. No hydronephrosis bilaterally    Culture 9/24 Urine Pending 9/24  Blood x 2 Pending 9/24 MRSA by PCR negative   Antibiotics: Aztreonam 9/24   X 1 dose Ceftriaxone 9/24 >>   DVT prophylaxis: Lovenox   Devices NA   LINES / TUBES:      Continuous Infusions: . sodium chloride 125 mL/hr at 01/12/14 0515    Objective: VITAL SIGNS: Temp: 98.4 F (36.9 C) (09/25 0738) Temp src: Oral (09/25 0738) BP: 101/30 mmHg (09/25 0738) Pulse Rate: 83 (09/25 0738) SPO2; 100% on room air FIO2:   Intake/Output Summary (Last 24 hours) at 01/12/14 0932 Last data filed at 01/12/14 0615  Gross per 24 hour  Intake 697.92 ml  Output    551 ml  Net 146.92 ml     Exam: General: Obtunded, moans to painful stimuli, does not withdraw, No acute respiratory distress Lungs: Clear to  auscultation bilaterally without wheezes or crackles Cardiovascular: Regular rate and rhythm without murmur gallop or rub normal S1 and S2 Abdomen: Nontender, nondistended, soft, bowel sounds positive, no rebound, no ascites, no appreciable mass Extremities: No significant cyanosis, clubbing, or edema bilateral lower extremities  Data Reviewed: Basic Metabolic Panel:  Recent Labs Lab 01/11/14 1325 01/12/14 0340  NA 154* 165*  K 4.0 3.5*  CL 115* 128*  CO2 24 24  GLUCOSE 380* 143*  BUN 118* 109*  CREATININE 3.26* 2.42*    CALCIUM 8.5 8.4   Liver Function Tests:  Recent Labs Lab 01/11/14 1325 01/12/14 0340  AST 75* 40*  ALT 63* 52*  ALKPHOS 104 96  BILITOT 0.8 0.5  PROT 6.2 5.8*  ALBUMIN 2.4* 2.2*   No results found for this basename: LIPASE, AMYLASE,  in the last 168 hours No results found for this basename: AMMONIA,  in the last 168 hours CBC:  Recent Labs Lab 01/11/14 1325 01/12/14 0340  WBC 13.1* 14.4*  NEUTROABS 10.5*  --   HGB 14.2 12.8  HCT 44.7 40.7  MCV 94.7 94.7  PLT 169 163   Cardiac Enzymes:  Recent Labs Lab 01/11/14 1325  TROPONINI <0.30   BNP (last 3 results) No results found for this basename: PROBNP,  in the last 8760 hours CBG:  Recent Labs Lab 01/11/14 2138 01/12/14 01/12/14 0402 01/12/14 0742  GLUCAP 379* 382* 126* 35*    Recent Results (from the past 240 hour(s))  MRSA PCR SCREENING     Status: None   Collection Time    01/11/14  8:20 PM      Result Value Ref Range Status   MRSA by PCR NEGATIVE  NEGATIVE Final   Comment:            The GeneXpert MRSA Assay (FDA     approved for NASAL specimens     only), is one component of a     comprehensive MRSA colonization     surveillance program. It is not     intended to diagnose MRSA     infection nor to guide or     monitor treatment for     MRSA infections.     Studies:  Recent x-ray studies have been reviewed in detail by the Attending Physician  Scheduled Meds:  Scheduled Meds: . antiseptic oral rinse  7 mL Mouth Rinse BID  . cefTRIAXone (ROCEPHIN)  IV  1 g Intravenous Q24H  . enoxaparin (LOVENOX) injection  30 mg Subcutaneous Q24H  . insulin aspart  0-15 Units Subcutaneous 6 times per day  . insulin detemir  25 Units Subcutaneous Daily    Time spent on care of this patient: 40 mins   Drema Dallas , MD   [facility] Office  [phone] Pager [phone]  On-Call/Text Page:      [email or web address]      password [email or web address]  If 7PM-7AM, please contact  night-coverage [email or web address] Password [email or web address] 01/12/2014, 9:32 AM   LOS: 1 day

## 2014-01-12 NOTE — Progress Notes (Signed)
Patient blood pressure low this AM. MD Joseph Art notified. New order obtained.

## 2014-01-13 DIAGNOSIS — E876 Hypokalemia: Secondary | ICD-10-CM

## 2014-01-13 DIAGNOSIS — E1165 Type 2 diabetes mellitus with hyperglycemia: Secondary | ICD-10-CM

## 2014-01-13 DIAGNOSIS — F0391 Unspecified dementia with behavioral disturbance: Secondary | ICD-10-CM

## 2014-01-13 DIAGNOSIS — G9341 Metabolic encephalopathy: Secondary | ICD-10-CM

## 2014-01-13 DIAGNOSIS — IMO0001 Reserved for inherently not codable concepts without codable children: Secondary | ICD-10-CM

## 2014-01-13 DIAGNOSIS — F03918 Unspecified dementia, unspecified severity, with other behavioral disturbance: Secondary | ICD-10-CM

## 2014-01-13 LAB — URINE CULTURE: Colony Count: >=100000

## 2014-01-13 LAB — CBC WITH DIFFERENTIAL/PLATELET
Basophils Absolute: 0.1 10*3/uL (ref 0.0–0.1)
Basophils Relative: 0 % (ref 0–1)
EOS ABS: 0.5 10*3/uL (ref 0.0–0.7)
Eosinophils Relative: 4 % (ref 0–5)
HCT: 38.1 % (ref 36.0–46.0)
Hemoglobin: 12.1 g/dL (ref 12.0–15.0)
LYMPHS ABS: 1.5 10*3/uL (ref 0.7–4.0)
Lymphocytes Relative: 11 % — ABNORMAL LOW (ref 12–46)
MCH: 29.9 pg (ref 26.0–34.0)
MCHC: 31.8 g/dL (ref 30.0–36.0)
MCV: 94.1 fL (ref 78.0–100.0)
MONOS PCT: 6 % (ref 3–12)
Monocytes Absolute: 0.9 10*3/uL (ref 0.1–1.0)
Neutro Abs: 10.8 10*3/uL — ABNORMAL HIGH (ref 1.7–7.7)
Neutrophils Relative %: 79 % — ABNORMAL HIGH (ref 43–77)
Platelets: 120 10*3/uL — ABNORMAL LOW (ref 150–400)
RBC: 4.05 MIL/uL (ref 3.87–5.11)
RDW: 13 % (ref 11.5–15.5)
WBC: 13.7 10*3/uL — ABNORMAL HIGH (ref 4.0–10.5)

## 2014-01-13 LAB — GLUCOSE, CAPILLARY
GLUCOSE-CAPILLARY: 210 mg/dL — AB (ref 70–99)
GLUCOSE-CAPILLARY: 217 mg/dL — AB (ref 70–99)
GLUCOSE-CAPILLARY: 251 mg/dL — AB (ref 70–99)
GLUCOSE-CAPILLARY: 81 mg/dL (ref 70–99)
Glucose-Capillary: 194 mg/dL — ABNORMAL HIGH (ref 70–99)
Glucose-Capillary: 286 mg/dL — ABNORMAL HIGH (ref 70–99)
Glucose-Capillary: 180 mg/dL — ABNORMAL HIGH (ref 70–99)

## 2014-01-13 LAB — COMPREHENSIVE METABOLIC PANEL
ALBUMIN: 2 g/dL — AB (ref 3.5–5.2)
ALT: 42 U/L — AB (ref 0–35)
AST: 35 U/L (ref 0–37)
Alkaline Phosphatase: 92 U/L (ref 39–117)
Anion gap: 11 (ref 5–15)
BUN: 63 mg/dL — ABNORMAL HIGH (ref 6–23)
CALCIUM: 8 mg/dL — AB (ref 8.4–10.5)
CO2: 22 mEq/L (ref 19–32)
Chloride: 118 mEq/L — ABNORMAL HIGH (ref 96–112)
Creatinine, Ser: 1.35 mg/dL — ABNORMAL HIGH (ref 0.50–1.10)
GFR calc Af Amer: 37 mL/min — ABNORMAL LOW (ref >90)
GFR calc non Af Amer: 32 mL/min — ABNORMAL LOW (ref >90)
Glucose, Bld: 272 mg/dL — ABNORMAL HIGH (ref 70–99)
Potassium: 3.5 mEq/L — ABNORMAL LOW (ref 3.7–5.3)
Sodium: 151 mEq/L — ABNORMAL HIGH (ref 137–147)
TOTAL PROTEIN: 5.2 g/dL — AB (ref 6.0–8.3)
Total Bilirubin: 0.4 mg/dL (ref 0.3–1.2)

## 2014-01-13 LAB — MAGNESIUM: Magnesium: 2.6 mg/dL — ABNORMAL HIGH (ref 1.5–2.5)

## 2014-01-13 MED ORDER — POTASSIUM CHLORIDE 10 MEQ/100ML IV SOLN
10.0000 meq | INTRAVENOUS | Status: AC
  Administered 2014-01-13 (×2): 10 meq via INTRAVENOUS
  Filled 2014-01-13 (×2): qty 100

## 2014-01-13 MED ORDER — INSULIN ASPART 100 UNIT/ML ~~LOC~~ SOLN
0.0000 [IU] | SUBCUTANEOUS | Status: DC
  Administered 2014-01-13: 4 [IU] via SUBCUTANEOUS
  Administered 2014-01-13: 7 [IU] via SUBCUTANEOUS
  Administered 2014-01-14: 3 [IU] via SUBCUTANEOUS

## 2014-01-13 NOTE — Progress Notes (Signed)
[place] TEAM 1 - Stepdown/ICU TEAM Progress Note  [name] ZOX:[id number] DOB: 1916-11-22 DOA: 01/11/2014 PCP: Sonda Primes, MD  Admit HPI / Brief Narrative: [name] is a 80 y.o. WF  PMHx  Advanced Dementia (per family at baseline cannot communicate needs) recently moved to [facility] from ALF due to need for higher level of care, Hx of DM type II on Insulin, CKD stage 2, HTN.  Per Daughter at baseline, she is alert and mumbles yes or no but does not talk otherwise, cannot recognize any family.  Today she was sent from SNF due to decreased responsiveness compared to baseline, temp of 102.  EMS was called her initial BP was 88/40 which improved some with IVF.  No other reliable history available at this time.  In ER, BP was low and improved with IVF bolus to 103/43, also noted to be tachypneic and febrile.  Labs revealed, hypernatremia, AKI, Elevated BUN, leukocytosis and abnormal UA with numerous WBCs and many bacteria.      HPI/Subjective: 9/26 patient noncommunicative, will open night's to mild stimulation and track you around room.   Assessment/Plan: Sepsis  -Urine positive for GNR awaiting speciation and susceptibility   -Continue D5W at 75 ml/hr  -Continue Ceftriaxone  -Blood and urine Cx pending -discussed at length with (daughter) Gwenith Spitz , and Clydie Braun Granddaughter at bedside, she is agreeable to conservative management only.  -NO PRESSORs, No Central Line, and DNR  -Strict in and out - Admission weight = 70.3 kg ; 9/26 weight= 64.8 kg; NOTE + 1.3 L since admission    UTI  -as above   Acute Kidney injury  -due to Sepsis, ATN  -last baseline creatinine of 1.2 in 12/14  -Renal US; no hydronephrosis medical renal disease - 9/26 Cr = 1.35   Toxic metabolic encephalopathy  -Multifactorial to include Sepsis, AKI, Uremia in the background of Advanced Dementia  -hold all PO meds and NPO till mentation improves   DIABETES  MELLITUS, TYPE II  -resume Levemir 25 units daily -Increase to Resistant SSI    Hypernatremia  - Continue D5 W. @ 69ml/hr   Hypokalemia -Potassium x 2 runs   Advanced DEMENTIA  -hold aricept   Hip pain and grimacing with movt  -Xrays of Hip; DJD    Code Status: DNR Family Communication: no family present at time of exam Disposition Plan: Resolution of sepsis    Consultants: NA   Procedure/Significant Events: 9/24 Bilat Hip X-ray; Degenerative joint changes of bilateral hips. No acute fracture or  dislocation 9/24 renal ultrasound; medical renal disease. No hydronephrosis bilaterally    Culture 9/24 Urine positive GNR 9/24  Blood Lt/Rt  Hand NGTD 9/24 MRSA by PCR negative   Antibiotics: Aztreonam 9/24   X 1 dose Ceftriaxone 9/24 >>   DVT prophylaxis: Lovenox   Devices NA   LINES / TUBES:      Continuous Infusions: . dextrose 125 mL (01/13/14 1123)    Objective: VITAL SIGNS: Temp: 98.2 F (36.8 C) (09/26 1217) Temp src: Oral (09/26 1217) BP: 115/35 mmHg (09/26 1327) Pulse Rate: 65 (09/26 1327) SPO2; 100% on room air FIO2:   Intake/Output Summary (Last 24 hours) at 01/13/14 1410 Last data filed at 01/13/14 1300  Gross per 24 hour  Intake   2000 ml  Output   1047 ml  Net    953 ml     Exam: General: Opens eyes to mild stimulation, will track you  around room. Noncommunicative. Will not cooperate with exam. No acute respiratory distress Lungs: Clear to auscultation bilaterally without wheezes or crackles Cardiovascular: Regular rate and rhythm without murmur gallop or rub normal S1 and S2 Abdomen: Nontender, nondistended, soft, bowel sounds positive, no rebound, no ascites, no appreciable mass Extremities: No significant cyanosis, clubbing, or edema bilateral lower extremities  Data Reviewed: Basic Metabolic Panel:  Recent Labs Lab 01/11/14 1325 01/12/14 0340 01/12/14 1520 01/13/14 1018  NA 154* 165* 159* 151*  K 4.0  3.5* 3.8 3.5*  CL 115* 128* 127* 118*  CO2 GLUCOSE 380* 143* 158* 272*  BUN 118* 109* 88* 63*  CREATININE 3.26* 2.42* 1.85* 1.35*  CALCIUM 8.5 8.4 7.9* 8.0*  MG  --   --  2.7* 2.6*   Liver Function Tests:  Recent Labs Lab 01/11/14 1325 01/12/14 0340 01/12/14 1520 01/13/14 1018  AST 75* 40* 32 35  ALT 63* 52* 40* 42*  ALKPHOS 104 96 80 92  BILITOT 0.8 0.5 0.5 0.4  PROT 6.2 5.8* 5.0* 5.2*  ALBUMIN 2.4* 2.2* 2.0* 2.0*   No results found for this basename: LIPASE, AMYLASE,  in the last 168 hours No results found for this basename: AMMONIA,  in the last 168 hours CBC:  Recent Labs Lab 01/11/14 1325 01/12/14 0340 01/13/14 1018  WBC 13.1* 14.4* 13.7*  NEUTROABS 10.5*  --  10.8*  HGB 14.2 12.8 12.1  HCT 44.7 40.7 38.1  MCV 94.7 94.7 94.1  PLT 169 163 120*   Cardiac Enzymes:  Recent Labs Lab 01/11/14 1325  TROPONINI <0.30   BNP (last 3 results) No results found for this basename: PROBNP,  in the last 8760 hours CBG:  Recent Labs Lab 01/12/14 2021 01/12/14 2351 01/13/14 0358 01/13/14 0754 01/13/14 1220  GLUCAP 197* 210* 194* 251* 286*    Recent Results (from the past 240 hour(s))  URINE CULTURE     Status: None   Collection Time    01/11/14  2:02 PM      Result Value Ref Range Status   Specimen Description URINE, CATHETERIZED   Final   Special Requests NONE   Final   Culture  Setup Time     Final   Value: 01/11/2014 19:37     Performed at [facility] Count     Final   Value: >=100,000 COLONIES/ML     Performed at [facility]   Culture     Final   Value: GRAM NEGATIVE RODS     Performed at [facility]   Report Status PENDING   Incomplete  CULTURE, BLOOD (ROUTINE X 2)     Status: None   Collection Time    01/11/14  2:14 PM      Result Value Ref Range Status   Specimen Description BLOOD HAND LEFT   Final   Special Requests BOTTLES DRAWN AEROBIC AND ANAEROBIC 5CC   Final   Culture  Setup Time      Final   Value: 01/12/2014 07:44     Performed at [facility]   Culture     Final   Value:        BLOOD CULTURE RECEIVED NO GROWTH TO DATE CULTURE WILL BE HELD FOR 5 DAYS BEFORE ISSUING A FINAL NEGATIVE REPORT     Performed at [facility]   Report Status PENDING   Incomplete  CULTURE, BLOOD (ROUTINE X 2)     Status: None  Collection Time    01/11/14  2:19 PM      Result Value Ref Range Status   Specimen Description BLOOD HAND RIGHT   Final   Special Requests BOTTLES DRAWN AEROBIC ONLY 4CC   Final   Culture  Setup Time     Final   Value: 01/12/2014 07:43     Performed at [facility]   Culture     Final   Value:        BLOOD CULTURE RECEIVED NO GROWTH TO DATE CULTURE WILL BE HELD FOR 5 DAYS BEFORE ISSUING A FINAL NEGATIVE REPORT     Performed at [facility]   Report Status PENDING   Incomplete  MRSA PCR SCREENING     Status: None   Collection Time    01/11/14  8:20 PM      Result Value Ref Range Status   MRSA by PCR NEGATIVE  NEGATIVE Final   Comment:            The GeneXpert MRSA Assay (FDA     approved for NASAL specimens     only), is one component of a     comprehensive MRSA colonization     surveillance program. It is not     intended to diagnose MRSA     infection nor to guide or     monitor treatment for     MRSA infections.     Studies:  Recent x-ray studies have been reviewed in detail by the Attending Physician  Scheduled Meds:  Scheduled Meds: . antiseptic oral rinse  7 mL Mouth Rinse BID  . cefTRIAXone (ROCEPHIN)  IV  1 g Intravenous Q24H  . enoxaparin (LOVENOX) injection  30 mg Subcutaneous Q24H  . insulin aspart  0-15 Units Subcutaneous 6 times per day  . insulin detemir  25 Units Subcutaneous Daily    Time spent on care of this patient: 40 mins   Drema Dallas , MD   [facility] Office  [phone] Pager [phone]  On-Call/Text Page:      [email or web address]      password [email or web address]  If 7PM-7AM, please  contact night-coverage [email or web address] Password [email or web address] 01/13/2014, 2:10 PM   LOS: 2 days

## 2014-01-14 DIAGNOSIS — N390 Urinary tract infection, site not specified: Secondary | ICD-10-CM

## 2014-01-14 LAB — COMPREHENSIVE METABOLIC PANEL
ALK PHOS: 99 U/L (ref 39–117)
ALT: 43 U/L — AB (ref 0–35)
ANION GAP: 11 (ref 5–15)
AST: 39 U/L — ABNORMAL HIGH (ref 0–37)
Albumin: 2.1 g/dL — ABNORMAL LOW (ref 3.5–5.2)
BUN: 46 mg/dL — AB (ref 6–23)
CO2: 24 meq/L (ref 19–32)
Calcium: 8.2 mg/dL — ABNORMAL LOW (ref 8.4–10.5)
Chloride: 113 mEq/L — ABNORMAL HIGH (ref 96–112)
Creatinine, Ser: 1.22 mg/dL — ABNORMAL HIGH (ref 0.50–1.10)
GFR, EST AFRICAN AMERICAN: 42 mL/min — AB (ref >90)
GFR, EST NON AFRICAN AMERICAN: 36 mL/min — AB (ref >90)
GLUCOSE: 74 mg/dL (ref 70–99)
POTASSIUM: 3.7 meq/L (ref 3.7–5.3)
Sodium: 148 mEq/L — ABNORMAL HIGH (ref 137–147)
TOTAL PROTEIN: 5.4 g/dL — AB (ref 6.0–8.3)
Total Bilirubin: 0.4 mg/dL (ref 0.3–1.2)

## 2014-01-14 LAB — GLUCOSE, CAPILLARY
GLUCOSE-CAPILLARY: 76 mg/dL (ref 70–99)
Glucose-Capillary: 102 mg/dL — ABNORMAL HIGH (ref 70–99)
Glucose-Capillary: 108 mg/dL — ABNORMAL HIGH (ref 70–99)
Glucose-Capillary: 119 mg/dL — ABNORMAL HIGH (ref 70–99)
Glucose-Capillary: 144 mg/dL — ABNORMAL HIGH (ref 70–99)
Glucose-Capillary: 62 mg/dL — ABNORMAL LOW (ref 70–99)
Glucose-Capillary: 85 mg/dL (ref 70–99)

## 2014-01-14 LAB — CBC WITH DIFFERENTIAL/PLATELET
BASOS ABS: 0 10*3/uL (ref 0.0–0.1)
BASOS PCT: 0 % (ref 0–1)
EOS ABS: 0.7 10*3/uL (ref 0.0–0.7)
Eosinophils Relative: 5 % (ref 0–5)
HCT: 36.8 % (ref 36.0–46.0)
HEMOGLOBIN: 12.3 g/dL (ref 12.0–15.0)
Lymphocytes Relative: 15 % (ref 12–46)
Lymphs Abs: 1.9 10*3/uL (ref 0.7–4.0)
MCH: 30.8 pg (ref 26.0–34.0)
MCHC: 33.4 g/dL (ref 30.0–36.0)
MCV: 92 fL (ref 78.0–100.0)
MONOS PCT: 7 % (ref 3–12)
Monocytes Absolute: 0.8 10*3/uL (ref 0.1–1.0)
NEUTROS ABS: 8.8 10*3/uL — AB (ref 1.7–7.7)
NEUTROS PCT: 73 % (ref 43–77)
PLATELETS: 125 10*3/uL — AB (ref 150–400)
RBC: 4 MIL/uL (ref 3.87–5.11)
RDW: 12.6 % (ref 11.5–15.5)
WBC: 12.1 10*3/uL — ABNORMAL HIGH (ref 4.0–10.5)

## 2014-01-14 LAB — MAGNESIUM: MAGNESIUM: 2.5 mg/dL (ref 1.5–2.5)

## 2014-01-14 MED ORDER — DEXTROSE 50 % IV SOLN
INTRAVENOUS | Status: AC
  Filled 2014-01-14: qty 50

## 2014-01-14 MED ORDER — DEXTROSE 50 % IV SOLN
25.0000 mL | Freq: Once | INTRAVENOUS | Status: AC
  Administered 2014-01-14: 25 mL via INTRAVENOUS
  Filled 2014-01-14: qty 50

## 2014-01-14 NOTE — Progress Notes (Signed)
CBG=62 given 25 cc d50 w/ ffup cbg of 102.

## 2014-01-14 NOTE — Progress Notes (Signed)
[place] TEAM 1 - Stepdown/ICU TEAM Progress Note  [name] ZOX:[id number] DOB: August 10, 1916 DOA: 01/11/2014 PCP: Sonda Primes, MD  Admit HPI / Brief Narrative: [name] is a 80 y.o. WF  PMHx  Advanced Dementia (per family at baseline cannot communicate needs) recently moved to [facility] from ALF due to need for higher level of care, Hx of DM type II on Insulin, CKD stage 2, HTN.  Per Daughter at baseline, she is alert and mumbles yes or no but does not talk otherwise, cannot recognize any family.  Today she was sent from SNF due to decreased responsiveness compared to baseline, temp of 102.  EMS was called her initial BP was 88/40 which improved some with IVF.  No other reliable history available at this time.  In ER, BP was low and improved with IVF bolus to 103/43, also noted to be tachypneic and febrile.  Labs revealed, hypernatremia, AKI, Elevated BUN, leukocytosis and abnormal UA with numerous WBCs and many bacteria.      HPI/Subjective: 9/27 patient noncommunicative, Mumbles Nonsense. Opens eyes Track you around room.   Assessment/Plan: Sepsis  -Urine positive for GNR awaiting speciation and susceptibility   -Continue D5W at 75 ml/hr  -Continue Ceftriaxone  -discussed at length with (daughter) Gwenith Spitz , and Clydie Braun Granddaughter at bedside, she is agreeable to conservative management only.  -NO PRESSORs, No Central Line, and DNR  -Strict in and out - Admission weight = 70.3 kg ; 9/27 weight= 66.2 kg; NOTE net + 480 L since admission    UTI  -as above   Acute Kidney injury  -due to Sepsis, ATN  -last baseline creatinine of 1.2 in 12/14  -Renal US; no hydronephrosis medical renal disease - 9/27 Cr = 1.22 (baseline)   Toxic metabolic encephalopathy  -Multifactorial to include Sepsis, AKI, Uremia in the background of Advanced Dementia  -hold all PO meds and NPO till mentation improves   DIABETES MELLITUS, TYPE II  -resume  Levemir 25 units daily -Continue Resistant SSI    Hypernatremia  - Continue D5 W. @ 69ml/hr   Hypokalemia -Resolved monitor closely    Advanced DEMENTIA  -hold aricept   Hip pain and grimacing with movt  -Xrays of Hip; DJD    Code Status: DNR Family Communication: no family present at time of exam Disposition Plan: Resolution of sepsis    Consultants: NA   Procedure/Significant Events: 9/24 Bilat Hip X-ray; Degenerative joint changes of bilateral hips. No acute fracture or  dislocation 9/24 renal ultrasound; medical renal disease. No hydronephrosis bilaterally    Culture 9/24 Urine positive GNR (Escherichia coli sensitive to ceftriaxone) 9/24  Blood Lt/Rt  Hand NGTD 9/24 MRSA by PCR negative   Antibiotics: Aztreonam 9/24   X 1 dose Ceftriaxone 9/24 >>   DVT prophylaxis: Lovenox   Devices NA   LINES / TUBES:      Continuous Infusions: . dextrose 75 mL/hr at 01/14/14 1700    Objective: VITAL SIGNS: Temp: 98.5 F (36.9 C) (09/27 1600) Temp src: Oral (09/27 1600) BP: 119/37 mmHg (09/27 1600) Pulse Rate: 58 (09/27 1600) SPO2; 99% on 2 L via [place]  FIO2:   Intake/Output Summary (Last 24 hours) at 01/14/14 1742 Last data filed at 01/14/14 1700  Gross per 24 hour  Intake   1775 ml  Output    800 ml  Net    975 ml     Exam: General: Opens eyes to mild stimulation, will  track you around room. Noncommunicative. Will not cooperate with exam. No acute respiratory distress Lungs: Clear to auscultation bilaterally without wheezes or crackles Cardiovascular: Regular rate and rhythm without murmur gallop or rub normal S1 and S2 Abdomen: Nontender, nondistended, soft, bowel sounds positive, no rebound, no ascites, no appreciable mass Extremities: No significant cyanosis, clubbing, or edema bilateral lower extremities  Data Reviewed: Basic Metabolic Panel:  Recent Labs Lab 01/11/14 1325 01/12/14 0340 01/12/14 1520 01/13/14 1018 01/14/14 0343    NA 154* 165* 159* 151* 148*  K 4.0 3.5* 3.8 3.5* 3.7  CL 115* 128* 127* 118* 113*  CO2 GLUCOSE 380* 143* 158* 272* 74  BUN 118* 109* 88* 63* 46*  CREATININE 3.26* 2.42* 1.85* 1.35* 1.22*  CALCIUM 8.5 8.4 7.9* 8.0* 8.2*  MG  --   --  2.7* 2.6* 2.5   Liver Function Tests:  Recent Labs Lab 01/11/14 1325 01/12/14 0340 01/12/14 1520 01/13/14 1018 01/14/14 0343  AST 75* 40* 32 35 39*  ALT 63* 52* 40* 42* 43*  ALKPHOS 104 96 80 92 99  BILITOT 0.8 0.5 0.5 0.4 0.4  PROT 6.2 5.8* 5.0* 5.2* 5.4*  ALBUMIN 2.4* 2.2* 2.0* 2.0* 2.1*   No results found for this basename: LIPASE, AMYLASE,  in the last 168 hours No results found for this basename: AMMONIA,  in the last 168 hours CBC:  Recent Labs Lab 01/11/14 1325 01/12/14 0340 01/13/14 1018 01/14/14 0343  WBC 13.1* 14.4* 13.7* 12.1*  NEUTROABS 10.5*  --  10.8* 8.8*  HGB 14.2 12.8 12.1 12.3  HCT 44.7 40.7 38.1 36.8  MCV 94.7 94.7 94.1 92.0  PLT 169 163 120* 125*   Cardiac Enzymes:  Recent Labs Lab 01/11/14 1325  TROPONINI <0.30   BNP (last 3 results) No results found for this basename: PROBNP,  in the last 8760 hours CBG:  Recent Labs Lab 01/14/14 0345 01/14/14 0411 01/14/14 0817 01/14/14 1146 01/14/14 1628  GLUCAP 62* 102* 108* 144* 119*    Recent Results (from the past 240 hour(s))  URINE CULTURE     Status: None   Collection Time    01/11/14  2:02 PM      Result Value Ref Range Status   Specimen Description URINE, CATHETERIZED   Final   Special Requests NONE   Final   Culture  Setup Time     Final   Value: 01/11/2014 19:37     Performed at [facility] Count     Final   Value: >=100,000 COLONIES/ML     Performed at [facility]   Culture     Final   Value: ESCHERICHIA COLI     Performed at [facility]   Report Status 01/13/2014 FINAL   Final   Organism ID, Bacteria ESCHERICHIA COLI   Final  CULTURE, BLOOD (ROUTINE X 2)     Status: None   Collection  Time    01/11/14  2:14 PM      Result Value Ref Range Status   Specimen Description BLOOD HAND LEFT   Final   Special Requests BOTTLES DRAWN AEROBIC AND ANAEROBIC 5CC   Final   Culture  Setup Time     Final   Value: 01/12/2014 07:44     Performed at [facility]   Culture     Final   Value:        BLOOD CULTURE RECEIVED NO GROWTH TO DATE CULTURE WILL  BE HELD FOR 5 DAYS BEFORE ISSUING A FINAL NEGATIVE REPORT     Performed at [facility]   Report Status PENDING   Incomplete  CULTURE, BLOOD (ROUTINE X 2)     Status: None   Collection Time    01/11/14  2:19 PM      Result Value Ref Range Status   Specimen Description BLOOD HAND RIGHT   Final   Special Requests BOTTLES DRAWN AEROBIC ONLY 4CC   Final   Culture  Setup Time     Final   Value: 01/12/2014 07:43     Performed at [facility]   Culture     Final   Value:        BLOOD CULTURE RECEIVED NO GROWTH TO DATE CULTURE WILL BE HELD FOR 5 DAYS BEFORE ISSUING A FINAL NEGATIVE REPORT     Performed at [facility]   Report Status PENDING   Incomplete  MRSA PCR SCREENING     Status: None   Collection Time    01/11/14  8:20 PM      Result Value Ref Range Status   MRSA by PCR NEGATIVE  NEGATIVE Final   Comment:            The GeneXpert MRSA Assay (FDA     approved for NASAL specimens     only), is one component of a     comprehensive MRSA colonization     surveillance program. It is not     intended to diagnose MRSA     infection nor to guide or     monitor treatment for     MRSA infections.     Studies:  Recent x-ray studies have been reviewed in detail by the Attending Physician  Scheduled Meds:  Scheduled Meds: . antiseptic oral rinse  7 mL Mouth Rinse BID  . cefTRIAXone (ROCEPHIN)  IV  1 g Intravenous Q24H  . enoxaparin (LOVENOX) injection  30 mg Subcutaneous Q24H  . insulin aspart  0-20 Units Subcutaneous 6 times per day  . insulin detemir  25 Units Subcutaneous Daily    Time spent on  care of this patient: 40 mins   Drema Dallas , MD   [facility] Office  [phone] Pager [phone]  On-Call/Text Page:      [email or web address]      password [email or web address]  If 7PM-7AM, please contact night-coverage [email or web address] Password [email or web address] 01/14/2014, 5:42 PM   LOS: 3 days

## 2014-01-15 LAB — COMPREHENSIVE METABOLIC PANEL
ALK PHOS: 105 U/L (ref 39–117)
ALT: 37 U/L — ABNORMAL HIGH (ref 0–35)
ANION GAP: 11 (ref 5–15)
AST: 37 U/L (ref 0–37)
Albumin: 2 g/dL — ABNORMAL LOW (ref 3.5–5.2)
BILIRUBIN TOTAL: 0.4 mg/dL (ref 0.3–1.2)
BUN: 32 mg/dL — AB (ref 6–23)
CO2: 23 mEq/L (ref 19–32)
Calcium: 7.9 mg/dL — ABNORMAL LOW (ref 8.4–10.5)
Chloride: 110 mEq/L (ref 96–112)
Creatinine, Ser: 1.1 mg/dL (ref 0.50–1.10)
GFR calc non Af Amer: 41 mL/min — ABNORMAL LOW (ref >90)
GFR, EST AFRICAN AMERICAN: 48 mL/min — AB (ref >90)
GLUCOSE: 63 mg/dL — AB (ref 70–99)
Potassium: 3.5 mEq/L — ABNORMAL LOW (ref 3.7–5.3)
Sodium: 144 mEq/L (ref 137–147)
TOTAL PROTEIN: 5.2 g/dL — AB (ref 6.0–8.3)

## 2014-01-15 LAB — CBC WITH DIFFERENTIAL/PLATELET
BASOS ABS: 0 10*3/uL (ref 0.0–0.1)
Basophils Relative: 0 % (ref 0–1)
Eosinophils Absolute: 0.6 10*3/uL (ref 0.0–0.7)
Eosinophils Relative: 6 % — ABNORMAL HIGH (ref 0–5)
HCT: 36.3 % (ref 36.0–46.0)
Hemoglobin: 12 g/dL (ref 12.0–15.0)
Lymphocytes Relative: 19 % (ref 12–46)
Lymphs Abs: 1.8 10*3/uL (ref 0.7–4.0)
MCH: 30.2 pg (ref 26.0–34.0)
MCHC: 33.1 g/dL (ref 30.0–36.0)
MCV: 91.4 fL (ref 78.0–100.0)
Monocytes Absolute: 0.7 10*3/uL (ref 0.1–1.0)
Monocytes Relative: 7 % (ref 3–12)
NEUTROS ABS: 6.6 10*3/uL (ref 1.7–7.7)
Neutrophils Relative %: 68 % (ref 43–77)
PLATELETS: 112 10*3/uL — AB (ref 150–400)
RBC: 3.97 MIL/uL (ref 3.87–5.11)
RDW: 12.4 % (ref 11.5–15.5)
WBC: 9.7 10*3/uL (ref 4.0–10.5)

## 2014-01-15 LAB — GLUCOSE, CAPILLARY
GLUCOSE-CAPILLARY: 120 mg/dL — AB (ref 70–99)
GLUCOSE-CAPILLARY: 123 mg/dL — AB (ref 70–99)
Glucose-Capillary: 57 mg/dL — ABNORMAL LOW (ref 70–99)
Glucose-Capillary: 53 mg/dL — ABNORMAL LOW (ref 70–99)
Glucose-Capillary: 123 mg/dL — ABNORMAL HIGH (ref 70–99)
Glucose-Capillary: 170 mg/dL — ABNORMAL HIGH (ref 70–99)
Glucose-Capillary: 270 mg/dL — ABNORMAL HIGH (ref 70–99)

## 2014-01-15 LAB — MAGNESIUM: Magnesium: 2.4 mg/dL (ref 1.5–2.5)

## 2014-01-15 MED ORDER — BOOST / RESOURCE BREEZE PO LIQD
1.0000 | Freq: Three times a day (TID) | ORAL | Status: DC
  Administered 2014-01-15 – 2014-01-17 (×3): 1 via ORAL

## 2014-01-15 MED ORDER — DEXTROSE 50 % IV SOLN
25.0000 mL | Freq: Once | INTRAVENOUS | Status: AC
  Administered 2014-01-15: 25 mL via INTRAVENOUS

## 2014-01-15 MED ORDER — INSULIN ASPART 100 UNIT/ML ~~LOC~~ SOLN
0.0000 [IU] | Freq: Three times a day (TID) | SUBCUTANEOUS | Status: DC
  Administered 2014-01-15: 3 [IU] via SUBCUTANEOUS
  Administered 2014-01-15 – 2014-01-16 (×2): 4 [IU] via SUBCUTANEOUS
  Administered 2014-01-16: 11 [IU] via SUBCUTANEOUS

## 2014-01-15 MED ORDER — POTASSIUM CHLORIDE 10 MEQ/100ML IV SOLN
10.0000 meq | INTRAVENOUS | Status: AC
  Administered 2014-01-15 (×3): 10 meq via INTRAVENOUS
  Filled 2014-01-15 (×4): qty 100

## 2014-01-15 MED ORDER — INSULIN DETEMIR 100 UNIT/ML ~~LOC~~ SOLN
18.0000 [IU] | Freq: Every day | SUBCUTANEOUS | Status: DC
  Administered 2014-01-16: 18 [IU] via SUBCUTANEOUS
  Filled 2014-01-15: qty 0.18

## 2014-01-15 MED ORDER — DEXTROSE 50 % IV SOLN
INTRAVENOUS | Status: AC
  Filled 2014-01-15: qty 50

## 2014-01-15 NOTE — Progress Notes (Signed)
Pt's daughter Dorisann Frames notified of patient's impending transfer to [address] room 7. Will continue to monitor. Asher Muir Sivan Cuello,RN

## 2014-01-15 NOTE — Progress Notes (Addendum)
[place] TEAM 1 - Stepdown/ICU TEAM Progress Note  [name] EAV:[id number] DOB: 15-Jan-1917 DOA: 01/11/2014 PCP: Sonda Primes, MD  Admit HPI / Brief Narrative: 80 yo F w/ Hx of Advanced Dementia (per family mumbles yes/no but does not talk otherwise, cannot recognize any family) who recently moved to [facility] SNF from ALF due to need for higher level of care, DM2 on Insulin, CKD stage 2, and HTN who was sent to the ED due to decreased responsiveness compared to baseline, and a temp of 102.  Her initial BP was 88/40 which improved with IVF. Labs revealed hypernatremia, AKI, elevated BUN, leukocytosis and abnormal UA with numerous WBCs and many bacteria.   Since her admission, the pt has undergone tx for an E coli UTI w/ sepsis.  Sepsis physiology has essentially resolved, and her acute renal failure has likewise resolved.  The remaining issue requiring attention is that of her mental status.  She has not yet become alert enough to allow normalization of her diet w/ consistent intake.  She is to be moved to a medical bed in hopes to minimize ICU delirium.  PT/OT will be asked to see her, and attempts will be made to advance toward a regular diet.    HPI/Subjective: Pt is unresponsive.  She will not open her eyes of follow the examiner.    Assessment/Plan:  Sepsis due to UTI/Pyelo -sepsis physiology has resolved  -Dr. Joseph Art discussed goals at length with daughters Gwenith Spitz, and Clydie Braun (Granddaughter) at bedside who were agreeable to conservative management only -no pressors, no central line, and DNR    E coli UTI - quinolone resistant  -will complete 10 days of abx tx   Acute Kidney injury  -now resolved - due to sepsis > ATN - baseline creatinine of 1.2 in 12/14 - renal US w/ no hydronephrosis and medical renal disease  Toxic metabolic encephalopathy  -Multifactorial to include Sepsis, AKI, Uremia in the background of Advanced Dementia - transfer to medical  bed in attempt to minimize ICU/SUD delirium component  -hold all PO meds and NPO till mentation improves   DIABETES MELLITUS, TYPE II  -CBG erratic, likely due to very poor intake - adjust tx and follow   Hypernatremia  - resolved w/ free water admin  Hypokalemia -Resolved, but trending down again due to poor intake - follow    Advanced DEMENTIA  -resume aricept when able to take orals    Hip pain -Xrays of Hip reveal DJD w/ no fx   Code Status: DNR Family Communication: no family present at time of exam Disposition Plan: med bed - follow mental status   Consultants: NA  Significant Events: 9/24 Bilat Hip X-ray; Degenerative joint changes of bilateral hips. No acute fracture or  dislocation 9/24 renal ultrasound; medical renal disease. No hydronephrosis bilaterally  Antibiotics: Aztreonam 9/24   Ceftriaxone 9/24 >  DVT prophylaxis:  Lovenox  Objective: Blood pressure 116/22, pulse 55, temperature 98.8 F (37.1 C), temperature source Axillary, resp. rate 20, weight 67.1 kg (147 lb 14.9 oz), SpO2 100.00%.  Intake/Output Summary (Last 24 hours) at 01/15/14 0910 Last data filed at 01/15/14 0700  Gross per 24 hour  Intake   1575 ml  Output    755 ml  Net    820 ml   Exam: General: Noncommunicative. No acute respiratory distress Lungs: Clear to auscultation bilaterally without wheezes or crackles Cardiovascular: Regular rate and rhythm without murmur gallop or rub  Abdomen: Nontender, nondistended,  soft, bowel sounds positive, no rebound, no ascites, no appreciable mass Extremities: No significant cyanosis, clubbing, or edema bilateral lower extremities - B feet in heel sparing boots   Data Reviewed: Basic Metabolic Panel:  Recent Labs Lab 01/12/14 0340 01/12/14 1520 01/13/14 1018 01/14/14 0343 01/15/14 0228  NA 165* 159* 151* 148* 144  K 3.5* 3.8 3.5* 3.7 3.5*  CL 128* 127* 118* 113* 110  CO2 GLUCOSE 143* 158* 272* 74 63*  BUN 109* 88*  63* 46* 32*  CREATININE 2.42* 1.85* 1.35* 1.22* 1.10  CALCIUM 8.4 7.9* 8.0* 8.2* 7.9*  MG  --  2.7* 2.6* 2.5 2.4   Liver Function Tests:  Recent Labs Lab 01/12/14 0340 01/12/14 1520 01/13/14 1018 01/14/14 0343 01/15/14 0228  AST 40* 32 35 39* 37  ALT 52* 40* 42* 43* 37*  ALKPHOS 96 80 92 99 105  BILITOT 0.5 0.5 0.4 0.4 0.4  PROT 5.8* 5.0* 5.2* 5.4* 5.2*  ALBUMIN 2.2* 2.0* 2.0* 2.1* 2.0*   CBC:  Recent Labs Lab 01/11/14 1325 01/12/14 0340 01/13/14 1018 01/14/14 0343 01/15/14 0228  WBC 13.1* 14.4* 13.7* 12.1* 9.7  NEUTROABS 10.5*  --  10.8* 8.8* 6.6  HGB 14.2 12.8 12.1 12.3 12.0  HCT 44.7 40.7 38.1 36.8 36.3  MCV 94.7 94.7 94.1 92.0 91.4  PLT 169 163 120* 125* 112*   CBG:  Recent Labs Lab 01/14/14 2337 01/15/14 0328 01/15/14 0331 01/15/14 0506 01/15/14 0801  GLUCAP 85 57* 53* 123* 120*    Recent Results (from the past 240 hour(s))  URINE CULTURE     Status: None   Collection Time    01/11/14  2:02 PM      Result Value Ref Range Status   Specimen Description URINE, CATHETERIZED   Final   Special Requests NONE   Final   Culture  Setup Time     Final   Value: 01/11/2014 19:37     Performed at [facility] Count     Final   Value: >=100,000 COLONIES/ML     Performed at [facility]   Culture     Final   Value: ESCHERICHIA COLI     Performed at [facility]   Report Status 01/13/2014 FINAL   Final   Organism ID, Bacteria ESCHERICHIA COLI   Final  CULTURE, BLOOD (ROUTINE X 2)     Status: None   Collection Time    01/11/14  2:14 PM      Result Value Ref Range Status   Specimen Description BLOOD HAND LEFT   Final   Special Requests BOTTLES DRAWN AEROBIC AND ANAEROBIC 5CC   Final   Culture  Setup Time     Final   Value: 01/12/2014 07:44     Performed at [facility]   Culture     Final   Value:        BLOOD CULTURE RECEIVED NO GROWTH TO DATE CULTURE WILL BE HELD FOR 5 DAYS BEFORE ISSUING A FINAL NEGATIVE REPORT      Performed at [facility]   Report Status PENDING   Incomplete  CULTURE, BLOOD (ROUTINE X 2)     Status: None   Collection Time    01/11/14  2:19 PM      Result Value Ref Range Status   Specimen Description BLOOD HAND RIGHT   Final   Special Requests BOTTLES DRAWN AEROBIC ONLY 4CC   Final  Culture  Setup Time     Final   Value: 01/12/2014 07:43     Performed at [facility]   Culture     Final   Value:        BLOOD CULTURE RECEIVED NO GROWTH TO DATE CULTURE WILL BE HELD FOR 5 DAYS BEFORE ISSUING A FINAL NEGATIVE REPORT     Performed at [facility]   Report Status PENDING   Incomplete  MRSA PCR SCREENING     Status: None   Collection Time    01/11/14  8:20 PM      Result Value Ref Range Status   MRSA by PCR NEGATIVE  NEGATIVE Final   Comment:            The GeneXpert MRSA Assay (FDA     approved for NASAL specimens     only), is one component of a     comprehensive MRSA colonization     surveillance program. It is not     intended to diagnose MRSA     infection nor to guide or     monitor treatment for     MRSA infections.     Studies:  Recent x-ray studies have been reviewed in detail by the Attending Physician  Scheduled Meds:  Scheduled Meds: . antiseptic oral rinse  7 mL Mouth Rinse BID  . cefTRIAXone (ROCEPHIN)  IV  1 g Intravenous Q24H  . enoxaparin (LOVENOX) injection  30 mg Subcutaneous Q24H  . insulin aspart  0-20 Units Subcutaneous 6 times per day  . insulin detemir  25 Units Subcutaneous Daily    Time spent on care of this patient: 35 mins  Lonia Blood, MD [facility] For Consults/Admissions - Flow Manager - [phone] Office  [phone] Pager [phone]  On-Call/Text Page:      [email or web address]      password [facility]  01/15/2014, 9:10 AM   LOS: 4 days

## 2014-01-15 NOTE — Progress Notes (Signed)
INITIAL NUTRITION ASSESSMENT  DOCUMENTATION CODES Per approved criteria  -Not Applicable   INTERVENTION: - Resource Breeze po TID, each supplement provides 250 kcal and 9 grams of protein - RD will continue to monitor for nutrition care plan.  NUTRITION DIAGNOSIS: Inadequate oral intake related to inability to eat as evidenced by NPO.   Goal: Pt to meet >/= 90% of their estimated nutrition needs   Monitor:  Weight trend, po intake, acceptance of supplements, labs, diet advancement  Reason for Assessment: Low Braden Score  80 y.o. female  Admitting Dx: Sepsis  ASSESSMENT: 80 y.o. female with PMH of Advanced Dementia recently moved to [facility] SNF from ALF due to need for higher level of care, she also has h/o of DM on Insulin, CKD 2, HTN.  - Pt not arousable during RD visit. Was mumbling and groaning.  - Pt's diet was NPO, but attempts are being made to advance pt to regular diet. She has been ordered clear liquids. Per MD note, family wishes for conservative management only.  - Pt with minor wasting of temples.   Labs: CBGs: 53-123 K Low BUN elevated Na WNL  Height: Ht Readings from Last 1 Encounters:  10/08/10 5' 0.25" (1.53 m)    Weight: Wt Readings from Last 1 Encounters:  01/15/14 147 lb 14.9 oz (67.1 kg)    Ideal Body Weight: 105 lbs  % Ideal Body Weight: 140%  Wt Readings from Last 10 Encounters:  01/15/14 147 lb 14.9 oz (67.1 kg)  01/12/11 160 lb (72.576 kg)  10/08/10 157 lb (71.215 kg)  08/04/10 158 lb (71.668 kg)  06/30/10 154 lb 12 oz (70.194 kg)  02/14/10 148 lb (67.132 kg)  11/14/09 145 lb (65.772 kg)  07/19/09 139 lb 8 oz (63.277 kg)  07/17/09 140 lb (63.504 kg)  04/18/09 146 lb (66.225 kg)    Usual Body Weight: unknown  % Usual Body Weight: n/a  BMI:  Body mass index is 28.66 kg/(m^2).  Estimated Nutritional Needs: Kcal: 1650-1850 Protein: 80-90 g Fluid: 1.7-1.9 L/day  Skin: stage I pressure ulcer on coccyx, deep tissue  injury on bilateral heels  Diet Order: Clear Liquid  EDUCATION NEEDS: -Education not appropriate at this time   Intake/Output Summary (Last 24 hours) at 01/15/14 1001 Last data filed at 01/15/14 0800  Gross per 24 hour  Intake   1500 ml  Output    895 ml  Net    605 ml    Last BM: 9/24   Labs:   Recent Labs Lab 01/13/14 1018 01/14/14 0343 01/15/14 0228  NA 151* 148* 144  K 3.5* 3.7 3.5*  CL 118* 113* 110  CO2 BUN 63* 46* 32*  CREATININE 1.35* 1.22* 1.10  CALCIUM 8.0* 8.2* 7.9*  MG 2.6* 2.5 2.4  GLUCOSE 272* 74 63*    CBG (last 3)   Recent Labs  01/15/14 0331 01/15/14 0506 01/15/14 0801  GLUCAP 53* 123* 120*    Scheduled Meds: . antiseptic oral rinse  7 mL Mouth Rinse BID  . cefTRIAXone (ROCEPHIN)  IV  1 g Intravenous Q24H  . enoxaparin (LOVENOX) injection  30 mg Subcutaneous Q24H  . insulin aspart  0-20 Units Subcutaneous TID WC  . [START ON 01/16/2014] insulin detemir  18 Units Subcutaneous Daily  . potassium chloride  10 mEq Intravenous Q1 Hr x 3    Continuous Infusions: . dextrose 75 mL/hr at 01/15/14 0210    Past Medical History  Diagnosis Date  .  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 Esophageal reflux   . Osteoporosis, unspecified   . Type II or unspecified type diabetes mellitus with ophthalmic manifestations, not stated as uncontrolled   . Chronic kidney disease, unspecified   . Other and unspecified hyperlipidemia   . Type II or unspecified type diabetes mellitus without mention of complication, not stated as uncontrolled   . Mitral valve insufficiency and aortic valve insufficiency   . Acute myocardial infarction, unspecified site, episode of care unspecified   . Atrial fibrillation   . Peptic ulcer, unspecified site, unspecified as acute or chronic, without mention of hemorrhage, perforation, or obstruction   . 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  Esophageal reflux   . Osteoporosis, unspecified   . Type II or unspecified type diabetes mellitus with ophthalmic manifestations, not stated as uncontrolled   . Chronic kidney disease, unspecified   . Type II or unspecified type diabetes mellitus without mention of complication, not stated as uncontrolled   . Mitral valve insufficiency and aortic valve insufficiency   . Acute myocardial infarction, unspecified site, episode of care unspecified   . Atrial fibrillation   . Hypertension   . Renal insufficiency   . Stasis dermatitis 2010    Dr. Terri Piedra    History reviewed. No pertinent past surgical history.  Ebbie Latus RD, LDN

## 2014-01-15 NOTE — Progress Notes (Signed)
Cbg=53,25 cc d50 given cbg123

## 2014-01-16 LAB — BASIC METABOLIC PANEL
Anion gap: 11 (ref 5–15)
BUN: 21 mg/dL (ref 6–23)
CALCIUM: 7.7 mg/dL — AB (ref 8.4–10.5)
CO2: 21 mEq/L (ref 19–32)
Chloride: 105 mEq/L (ref 96–112)
Creatinine, Ser: 0.91 mg/dL (ref 0.50–1.10)
GFR calc non Af Amer: 52 mL/min — ABNORMAL LOW (ref >90)
GFR, EST AFRICAN AMERICAN: 60 mL/min — AB (ref >90)
Glucose, Bld: 248 mg/dL — ABNORMAL HIGH (ref 70–99)
POTASSIUM: 3.7 meq/L (ref 3.7–5.3)
SODIUM: 137 meq/L (ref 137–147)

## 2014-01-16 LAB — CBC
HCT: 34.3 % — ABNORMAL LOW (ref 36.0–46.0)
Hemoglobin: 11.6 g/dL — ABNORMAL LOW (ref 12.0–15.0)
MCH: 30.1 pg (ref 26.0–34.0)
MCHC: 33.8 g/dL (ref 30.0–36.0)
MCV: 89.1 fL (ref 78.0–100.0)
Platelets: 127 10*3/uL — ABNORMAL LOW (ref 150–400)
RBC: 3.85 MIL/uL — AB (ref 3.87–5.11)
RDW: 12.2 % (ref 11.5–15.5)
WBC: 9.4 10*3/uL (ref 4.0–10.5)

## 2014-01-16 LAB — FOLATE: FOLATE: 6.8 ng/mL

## 2014-01-16 LAB — VITAMIN B12: VITAMIN B 12: 1735 pg/mL — AB (ref 211–911)

## 2014-01-16 LAB — GLUCOSE, CAPILLARY
GLUCOSE-CAPILLARY: 196 mg/dL — AB (ref 70–99)
Glucose-Capillary: 261 mg/dL — ABNORMAL HIGH (ref 70–99)

## 2014-01-16 MED ORDER — LORAZEPAM 2 MG/ML PO CONC
1.0000 mg | ORAL | Status: DC | PRN
  Filled 2014-01-16: qty 1

## 2014-01-16 MED ORDER — MORPHINE SULFATE (CONCENTRATE) 10 MG /0.5 ML PO SOLN: 5.0000 mg | ORAL | Status: DC | PRN

## 2014-01-16 MED ORDER — LORAZEPAM 2 MG/ML PO CONC
0.5000 mg | Freq: Two times a day (BID) | ORAL | Status: DC
  Administered 2014-01-16 – 2014-01-17 (×2): 0.5 mg via ORAL
  Filled 2014-01-16: qty 0.25
  Filled 2014-01-16 (×2): qty 1

## 2014-01-16 NOTE — Clinical Social Work Psychosocial (Signed)
Clinical Social Work Department BRIEF PSYCHOSOCIAL ASSESSMENT 01/16/2014  Patient:  [name],[name]     Account Number:  [id number]     Admit date:  01/11/2014  Clinical Social Worker:  Lavell LusterAMPBELL,Nayelli Inglis BRYANT, LCSWA  Date/Time:  01/16/2014 03:32 PM  Referred by:  Physician  Date Referred:  01/16/2014 Referred for  Residential hospice placement   Other Referral:   NA   Interview type:  Family Other interview type:   Patient unable to contribute to assessment at this time. Family interviewed at bedside.    PSYCHOSOCIAL DATA Living Status:  FACILITY Admitted from facility:  [facility] Level of care:  Skilled Nursing Facility Primary support name:  [name] and [name] Primary support relationship to patient:  CHILD, ADULT Degree of support available:   Support is strong.    CURRENT CONCERNS Current Concerns  Post-Acute Placement   Other Concerns:   NA    SOCIAL WORK ASSESSMENT / PLAN CSW informed by MD that the patient's family is requesting comfort measure for patient. [facility] Family is clearly saddened by the decision they have had to make but feel that it is in the patient's best interest. CSW provided emotional support to family and reinforced family's feelings of doing the right things. CSW explained residential hospice facility placement process and also shared with family that if the patient cannot got to one of the [place] facilities [place] she could [facility] services or go to a residential hospice facility out of county. Family reports that [facility]. CSW will assist.   Assessment/plan status:  Psychosocial Support/Ongoing Assessment of Needs Other assessment/ plan:   Make hospice referrals.   Information/referral to community resources:   CSW contact information given.     PATIENT'S/FAMILY'S RESPONSE TO PLAN OF CARE: Patient's family is requesting residential hospice placement for patient. CSW will assist.       [name] MSW, [place]CSWA, [place], [phone]

## 2014-01-16 NOTE — Progress Notes (Signed)
Discussed with family at bedside-they are agreeable to transition to comfort care. Will stop all IV antibiotics, no further blood work, no CBG monitoring, insulin, start scheduled low-dose lorazepam, and as needed Roxanol and Ativan. Family agreed with residential hospice, we'll consult social work.

## 2014-01-16 NOTE — Progress Notes (Signed)
OT Cancellation Note  Patient Details Name: Clayburn PertMartha V Hewes MRN: [id number] DOB: 31-May-1916   Cancelled Treatment:    Reason Eval/Treat Not Completed: Other (comment) Per PT, pt is total care and from SNF. Will defer OT eval.  Lennox LaityStone, Brendaliz Kuk Stafford [phone] 01/16/2014, 10:36 AM

## 2014-01-16 NOTE — Progress Notes (Signed)
Per MD, no IV access needed

## 2014-01-16 NOTE — Evaluation (Signed)
Physical Therapy Evaluation/ Discharge Patient Details Name: [name] MRN: [id number] DOB: [birth year] Today's Date: 01/16/2014   History of Present Illness  [name] is a 80 y.o. female with PMH of Advanced Dementia recently moved to [facility] SNF from ALF due to need for higher level of care, she also has h/o of DM on Insulin, CKD 2, HTN. Pt admitted due to period of unresponsiveness.  Clinical Impression  Pt nonverbal throughout with no obvious ability to follow verbal or tactile commands but does not resist movement initiated with tactile cues and assist. Pt at baseline total assist for all mobility per dgtr and plans to return to SNF. REcommend return to SNF without further need for acute therapy in the hospital setting. Recommend daily mobility and positioning for pressure relief. Will sign off with dgtr away via phone.     Follow Up Recommendations SNF    Equipment Recommendations  None recommended by PT    Recommendations for Other Services       Precautions / Restrictions Precautions Precautions: Fall Precaution Comments: nonverbal Restrictions Weight Bearing Restrictions: No      Mobility  Bed Mobility               General bed mobility comments: pt in recliner on arrival  Transfers Overall transfer level: Needs assistance   Transfers: Sit to/from Stand Sit to Stand: Total assist         General transfer comment: attempted to have pt stand from chair but with one person total assist barely able to elevate sacrum with total assist for anterior translation, use of pad and knees blocked. Total assist with cues and assist for reciprocal scooting to and from edge of chair for transfer  Ambulation/Gait                Stairs            Wheelchair Mobility    Modified Rankin (Stroke Patients Only)       Balance Overall balance assessment: Needs assistance   Sitting balance-Leahy Scale: Zero Sitting balance - Comments: max  assist for weight shift and midline positioning Postural control: Left lateral lean;Posterior lean                                   Pertinent Vitals/Pain Pain Assessment: Faces Faces Pain Scale: Hurts a little bit    Home Living Family/patient expects to be discharged to:: Skilled nursing facility                      Prior Function Level of Independence: Needs assistance   Gait / Transfers Assistance Needed: total assist to pivot with 2 people at SNF <2wks prior to hospital admission  ADL's / Homemaking Assistance Needed: total assist all ADLs  Comments: Hx provided by dgtr as unable to get facility to answer the phone     Hand Dominance        Extremity/Trunk Assessment   Upper Extremity Assessment: Generalized weakness;Difficult to assess due to impaired cognition           Lower Extremity Assessment: Generalized weakness;Difficult to assess due to impaired cognition      Cervical / Trunk Assessment: Kyphotic  Communication   Communication: Expressive difficulties (pt non-verbal for sometime)  Cognition Arousal/Alertness: Awake/alert Behavior During Therapy: Flat affect Overall Cognitive Status: History of cognitive impairments - at baseline  General Comments      Exercises General Exercises - Lower Extremity Long Arc Quad: PROM;Both;5 reps      Assessment/Plan    PT Assessment All further PT needs can be met in the next venue of care  PT Diagnosis Altered mental status;Generalized weakness   PT Problem List Decreased strength;Decreased cognition;Decreased balance;Decreased activity tolerance  PT Treatment Interventions     PT Goals (Current goals can be found in the Care Plan section) Acute Rehab PT Goals PT Goal Formulation: No goals set, d/c therapy    Frequency     Barriers to discharge        Co-evaluation               End of Session   Activity Tolerance: Patient limited by  fatigue Patient left: in chair;with call bell/phone within [name] Nurse Communication: Mobility status;Precautions;Need for lift equipment         Time: 1021-1032 PT Time Calculation (min): 11 min   Charges:   PT Evaluation $Initial PT Evaluation Tier I: 1 Procedure PT Treatments $Therapeutic Activity: 8-22 mins   PT G Codes:          [name] 01/16/2014, 10:59 AM [name], [place]

## 2014-01-16 NOTE — Progress Notes (Signed)
PATIENT DETAILS Name: [name] Age: 80 y.o. Sex: female Date of Birth: 12-21-1916 Admit Date: 01/11/2014 Admitting Physician Zannie CovePreetha Joseph, MD RUE:AVWUPCP:Alex Plotnikov, MD  Admit HPI / Brief Narrative:  80 yo F w/ Hx of Advanced Dementia (per family mumbles yes/no but does not talk otherwise, cannot recognize any family) who recently [facility] from [facility]F due to need for higher level of care, DM2 on Insulin, CKD stage 2, and HTN who was sent to the ED due to decreased responsiveness compared to baseline, and a temp of 102. Her initial BP was 88/40 which improved with IVF. Labs revealed hypernatremia, AKI, elevated BUN, leukocytosis and abnormal UA with numerous WBCs and many bacteria.  Since her admission, the pt has undergone tx for an E coli UTI w/ sepsis. Sepsis physiology has essentially resolved, and her acute renal failure has likewise resolved. The remaining issue requiring attention is that of her mental status. She has not yet become alert enough to allow normalization of her diet w/ consistent intake. This M.D., had a brief conversation with patient's daughter-Ms. Hart RochesterLawson on the phone, and goals of care including transition to comfort care was discussed, patient's daughter will discuss with other family members and will let us know. Suspect, patient would be a good candidate for residential hospice.  Subjective: Lethargic, does not respond appropriately to commands. Per RN eating only approximately 15-20% of her meals.  Assessment/Plan: Principal Problem: Sepsis due to UTI/Pyelo  -sepsis physiology has resolved  -Dr. Joseph ArtWoods discussed goals at length with daughters Gwenith SpitzJane Lawson, Sarah, Shelby, and Clydie BraunKaren (Granddaughter) at bedside who were agreeable to conservative management only-no pressors, no central line, and DNR . This M.D. spoke with Ms. Hart RochesterLawson over the phone-she is healthcare power of attorney, she confirmed that no aggressive care and desired at this time.  She will talk with other family members, before being transitioned to full comfort measures. Await family decision, but a great candidate for residential hospice.  E coli UTI - quinolone resistant  -will complete 10 days of abx tx. Currently on IV Rocephin since 9/24  Acute Kidney injury  -now resolved - due to sepsis > ATN - baseline creatinine of 1.2 in 12/14 - renal US w/ no hydronephrosis and medical renal disease   Toxic metabolic encephalopathy  -Multifactorial to include Sepsis, AKI, Uremia in the background of Advanced Dementia - transfer to medical bed in attempt to minimize ICU/SUD delirium component  - 9/20 all PO meds and NPO till mentation improves   DIABETES MELLITUS, TYPE II  -CBG erratic, likely due to very poor intake - adjust tx and follow   Hypernatremia  - resolved w/ free water admin   Hypokalemia  -Resolved, but trending down again due to poor intake - follow   Advanced DEMENTIA  -resume aricept when able to take orals   Hip pain  -Xrays of Hip reveal DJD w/ no fx   Disposition: Remain inpatient  DVT Prophylaxis: Prophylactic Lovenox   Code Status:  DNR  Family Communication Ms. Hart RochesterLawson  Procedures:  None  CONSULTS:  None  Time spent 40 minutes-which includes 50% of the time with face-to-face with patient/ family and coordinating care related to the above assessment and plan.    MEDICATIONS: Scheduled Meds: . antiseptic oral rinse  7 mL Mouth Rinse BID  . cefTRIAXone (ROCEPHIN)  IV  1 g Intravenous Q24H  . enoxaparin (LOVENOX) injection  30 mg Subcutaneous Q24H  .  feeding supplement (RESOURCE BREEZE)  1 Container Oral TID BM  . insulin aspart  0-20 Units Subcutaneous TID WC  . insulin detemir  18 Units Subcutaneous Daily   Continuous Infusions: . dextrose 50 mL (01/15/14 2010)   PRN Meds:.acetaminophen, acetaminophen, ondansetron (ZOFRAN) IV, ondansetron  Antibiotics: Anti-infectives   Start     Dose/Rate Route Frequency Ordered  Stop   01/11/14 2200  aztreonam (AZACTAM) 500 mg in dextrose 5 % 50 mL IVPB  Status:  Discontinued     500 mg 100 mL/hr over 30 Minutes Intravenous 3 times per day 01/11/14 1520 01/11/14 1651   01/11/14 1800  cefTRIAXone (ROCEPHIN) 1 g in dextrose 5 % 50 mL IVPB     1 g 100 mL/hr over 30 Minutes Intravenous Every 24 hours 01/11/14 1754     01/11/14 1600  vancomycin (VANCOCIN) IVPB 1000 mg/200 mL premix  Status:  Discontinued     1,000 mg 200 mL/hr over 60 Minutes Intravenous Every 48 hours 01/11/14 1521 01/11/14 1651   01/11/14 1530  cefTRIAXone (ROCEPHIN) 1 g in dextrose 5 % 50 mL IVPB     1 g 100 mL/hr over 30 Minutes Intravenous  Once 01/11/14 1519 01/11/14 1617   01/11/14 1330  aztreonam (AZACTAM) 2 g in dextrose 5 % 50 mL IVPB     2 g 100 mL/hr over 30 Minutes Intravenous  Once 01/11/14 1324 01/11/14 1526   01/11/14 1330  vancomycin (VANCOCIN) IVPB 1000 mg/200 mL premix  Status:  Discontinued     1,000 mg 200 mL/hr over 60 Minutes Intravenous  Once 01/11/14 1324 01/11/14 1521       PHYSICAL EXAM: Vital signs in last 24 hours: Filed Vitals:   01/15/14 2000 01/15/14 2051 01/16/14 0612 01/16/14 0642  BP:  94/47 93/24 113/42  Pulse:  63 62   Temp:  99.6 F (37.6 C) 97.8 F (36.6 C)   TempSrc:  Axillary Axillary   Resp: 19 20 18    Weight:   67.8 kg (149 lb 7.6 oz)   SpO2:  99% 100%     Weight change: 0.7 kg (1 lb 8.7 oz) Filed Weights   01/14/14 0348 01/15/14 0123 01/16/14 0612  Weight: 66.2 kg (145 lb 15.1 oz) 67.1 kg (147 lb 14.9 oz) 67.8 kg (149 lb 7.6 oz)   Body mass index is 28.96 kg/(m^2).   Gen Exam:Confused, lethargic-not following commands  Neck: Supple, No JVD.   Chest: B/L Clear.   CVS: S1 S2 Regular, no murmurs.  Abdomen: soft, BS +, non tender, non distended.  Extremities: no edema, lower extremities warm to touch. Skin: No Rash.   Wounds: N/A.   Intake/Output from previous day:  Intake/Output Summary (Last 24 hours) at 01/16/14 1113 Last data  filed at 01/16/14 1010  Gross per 24 hour  Intake 1136.66 ml  Output    825 ml  Net 311.66 ml     LAB RESULTS: CBC  Recent Labs Lab 01/11/14 1325 01/12/14 0340 01/13/14 1018 01/14/14 0343 01/15/14 0228 01/16/14 0610  WBC 13.1* 14.4* 13.7* 12.1* 9.7 9.4  HGB 14.2 12.8 12.1 12.3 12.0 11.6*  HCT 44.7 40.7 38.1 36.8 36.3 34.3*  PLT 169 163 120* 125* 112* 127*  MCV 94.7 94.7 94.1 92.0 91.4 89.1  MCH 30.1 29.8 29.9 30.8 30.2 30.1  MCHC 31.8 31.4 31.8 33.4 33.1 33.8  RDW 13.8 13.6 13.0 12.6 12.4 12.2  LYMPHSABS 1.2  --  1.5 1.9 1.8  --   MONOABS 1.3*  --  0.9 0.8 0.7  --   EOSABS 0.0  --  0.5 0.7 0.6  --   BASOSABS 0.0  --  0.1 0.0 0.0  --     Chemistries   Recent Labs Lab 01/12/14 0340 01/12/14 1520 01/13/14 1018 01/14/14 0343 01/15/14 0228 01/16/14 0610  NA 165* 159* 151* 148* 144 137  K 3.5* 3.8 3.5* 3.7 3.5* 3.7  CL 128* 127* 118* 113* 110 105  CO2 24 23 22 24 23 21   GLUCOSE 143* 158* 272* 74 63* 248*  BUN 109* 88* 63* 46* 32* 21  CREATININE 2.42* 1.85* 1.35* 1.22* 1.10 0.91  CALCIUM 8.4 7.9* 8.0* 8.2* 7.9* 7.7*  MG  --  2.7* 2.6* 2.5 2.4  --     CBG:  Recent Labs Lab 01/15/14 0801 01/15/14 1153 01/15/14 1723 01/15/14 2152 01/16/14 0744  GLUCAP 120* 123* 170* 270* 261*    GFR The CrCl is unknown because both a height and weight (above a minimum accepted value) are required for this calculation.  Coagulation profile  Recent Labs Lab 01/11/14 1325  INR 1.25    Cardiac Enzymes  Recent Labs Lab 01/11/14 1325  TROPONINI <0.30    No components found with this basename: POCBNP,  No results found for this basename: DDIMER,  in the last 72 hours No results found for this basename: HGBA1C,  in the last 72 hours No results found for this basename: CHOL, HDL, LDLCALC, TRIG, CHOLHDL, LDLDIRECT,  in the last 72 hours No results found for this basename: TSH, T4TOTAL, FREET3, T3FREE, THYROIDAB,  in the last 72 hours No results found for this  basename: VITAMINB12, FOLATE, FERRITIN, TIBC, IRON, RETICCTPCT,  in the last 72 hours No results found for this basename: LIPASE, AMYLASE,  in the last 72 hours  Urine Studies No results found for this basename: UACOL, UAPR, USPG, UPH, UTP, UGL, UKET, UBIL, UHGB, UNIT, UROB, ULEU, UEPI, UWBC, URBC, UBAC, CAST, CRYS, UCOM, BILUA,  in the last 72 hours  MICROBIOLOGY: Recent Results (from the past 240 hour(s))  URINE CULTURE     Status: None   Collection Time    01/11/14  2:02 PM      Result Value Ref Range Status   Specimen Description URINE, CATHETERIZED   Final   Special Requests NONE   Final   Culture  Setup Time     Final   Value: 01/11/2014 19:37     Performed at [facility] Count     Final   Value: >=100,000 COLONIES/ML     Performed at [facility]   Culture     Final   Value: ESCHERICHIA COLI     Performed at [facility]   Report Status 01/13/2014 FINAL   Final   Organism ID, Bacteria ESCHERICHIA COLI   Final  CULTURE, BLOOD (ROUTINE X 2)     Status: None   Collection Time    01/11/14  2:14 PM      Result Value Ref Range Status   Specimen Description BLOOD HAND LEFT   Final   Special Requests BOTTLES DRAWN AEROBIC AND ANAEROBIC 5CC   Final   Culture  Setup Time     Final   Value: 01/12/2014 07:44     Performed at [facility]   Culture     Final   Value:        BLOOD CULTURE RECEIVED NO GROWTH TO DATE CULTURE WILL BE HELD FOR 5 DAYS  BEFORE ISSUING A FINAL NEGATIVE REPORT     Performed at [facility]   Report Status PENDING   Incomplete  CULTURE, BLOOD (ROUTINE X 2)     Status: None   Collection Time    01/11/14  2:19 PM      Result Value Ref Range Status   Specimen Description BLOOD HAND RIGHT   Final   Special Requests BOTTLES DRAWN AEROBIC ONLY 4CC   Final   Culture  Setup Time     Final   Value: 01/12/2014 07:43     Performed at [facility]   Culture     Final   Value:        BLOOD CULTURE RECEIVED  NO GROWTH TO DATE CULTURE WILL BE HELD FOR 5 DAYS BEFORE ISSUING A FINAL NEGATIVE REPORT     Performed at [facility]   Report Status PENDING   Incomplete  MRSA PCR SCREENING     Status: None   Collection Time    01/11/14  8:20 PM      Result Value Ref Range Status   MRSA by PCR NEGATIVE  NEGATIVE Final   Comment:            The GeneXpert MRSA Assay (FDA     approved for NASAL specimens     only), is one component of a     comprehensive MRSA colonization     surveillance program. It is not     intended to diagnose MRSA     infection nor to guide or     monitor treatment for     MRSA infections.    RADIOLOGY STUDIES/RESULTS: Dg Hip Bilateral W/pelvis  01/11/2014   CLINICAL DATA:  Hip pain with movement  EXAM: BILATERAL HIP WITH PELVIS - 4+ VIEW  COMPARISON:  None.  FINDINGS: There is no acute fracture or dislocation. There are narrow bilateral hip joint spaces.  IMPRESSION: Degenerative joint changes of bilateral hips. No acute fracture or dislocation.   Electronically Signed   By: Sherian Rein M.D.   On: 01/11/2014 20:47   US Renal  01/11/2014   CLINICAL DATA:  Increase creatinine.  Chronic renal disease.  EXAM: RENAL/URINARY TRACT ULTRASOUND COMPLETE  COMPARISON:  None.  FINDINGS: Right Kidney:  Length: 9 cm. The right kidney is echogenic with cortical thinning. No mass or hydronephrosis visualized.  Left Kidney:  Length: 11.2 cm. The left kidney is echogenic with cortical thinning. No mass or hydronephrosis visualized.  Bladder:  The bladder is decompressed, not well seen.  There is incidental finding of gallstones.  IMPRESSION: Findings consistent with medical renal disease. There is no hydronephrosis bilaterally.   Electronically Signed   By: Sherian Rein M.D.   On: 01/11/2014 19:30   Dg Chest Port 1 View  01/11/2014   CLINICAL DATA:  Fever and altered mental status.  EXAM: PORTABLE CHEST - 1 VIEW  COMPARISON:  PA and lateral chest 04/17/2013. Single view of the chest  04/19/2013.  FINDINGS: The lungs are clear. Heart size is normal. No pneumothorax or pleural effusion.  IMPRESSION: Negative chest.   Electronically Signed   By: Drusilla Kanner M.D.   On: 01/11/2014 14:26    Jeoffrey Massed, MD  [facility] Pager:[phone]  If 7PM-7AM, please contact night-coverage [email or web address] Password [email or web address] 01/16/2014, 11:13 AM   LOS: 5 days   **Disclaimer: This note may have been dictated with voice recognition software. Similar sounding words can inadvertently be transcribed and this note  may contain transcription errors which may not have been corrected upon publication of note.**

## 2014-01-17 DIAGNOSIS — E1139 Type 2 diabetes mellitus with other diabetic ophthalmic complication: Secondary | ICD-10-CM

## 2014-01-17 DIAGNOSIS — R10819 Abdominal tenderness, unspecified site: Secondary | ICD-10-CM

## 2014-01-17 MED ORDER — LORAZEPAM 2 MG/ML PO CONC: 1.0000 mg | Freq: Three times a day (TID) | ORAL | Status: AC

## 2014-01-17 MED ORDER — MORPHINE SULFATE (CONCENTRATE) 20 MG/ML PO SOLN: 5.0000 mg | ORAL | Status: AC | PRN

## 2014-01-17 NOTE — Progress Notes (Signed)
[facility] called [facility].

## 2014-01-17 NOTE — Clinical Social Work Note (Signed)
Per MD patient ready for DC to [facility]. RN, patient, patient's family, and facility notified of DC. RN given number for report. DC packet on chart. [facility] transport requested for patient. CSW signing off.    Bryant Monroe Qin MSW, LCSWA, LCASA, [phone]  

## 2014-01-17 NOTE — Discharge Summary (Signed)
[facility] to be D/C'd Hospice per MD order.  Discussed with the patient and all questions fully answered.    Medication List    STOP taking these medications       acetaminophen 650 MG suppository  Commonly known as:  TYLENOL     aspirin 81 MG tablet     cholecalciferol 400 UNITS Tabs tablet  Commonly known as:  VITAMIN D     docusate sodium 100 MG capsule  Commonly known as:  COLACE     donepezil 5 MG tablet  Commonly known as:  ARICEPT     Fiber Powd     furosemide 20 MG tablet  Commonly known as:  LASIX     HUMALOG KWIKPEN 100 UNIT/ML KiwkPen  Generic drug:  insulin lispro     insulin detemir 100 UNIT/ML injection  Commonly known as:  LEVEMIR     lisinopril 2.5 MG tablet  Commonly known as:  PRINIVIL,ZESTRIL     multivitamin-lutein Caps capsule     omeprazole 20 MG capsule  Commonly known as:  PRILOSEC     pravastatin 20 MG tablet  Commonly known as:  PRAVACHOL     senna 8.6 MG Tabs tablet  Commonly known as:  SENOKOT     vitamin B-12 1000 MCG tablet  Commonly known as:  CYANOCOBALAMIN      TAKE these medications       LORazepam 2 MG/ML concentrated solution  Commonly known as:  ATIVAN  Take 0.5 mLs (1 mg total) by mouth every 8 (eight) hours.     morphine 20 MG/ML concentrated solution  Commonly known as:  ROXANOL  Take 0.25 mLs (5 mg total) by mouth every 3 (three) hours as needed for severe pain.          Patient escorted via stretcher, and D/C hospice via EMS.  Beckey DowningFlores, Adaliz Dobis F 01/17/2014 1:53 PM

## 2014-01-17 NOTE — Plan of Care (Signed)
Problem: Phase II Progression Outcomes Goal: Obtain order to discontinue catheter if appropriate Outcome: Not Met (add Reason) Patient is going to hospice, will be leaving with foley for [facility].

## 2014-01-17 NOTE — Discharge Summary (Signed)
Physician Discharge Summary  [name] ZOX:[id number] DOB: 05/14/1916 DOA: 01/11/2014  PCP: Sonda Primes, MD  Admit date: 01/11/2014 Discharge date: 01/17/2014  Time spent: 40 minutes  Recommendations for Outpatient Follow-up:  1. Followup with the hospice home and he  Discharge Diagnoses:  Principal Problem:   Sepsis Active Problems:   DIABETES MELLITUS, TYPE II   DEMENTIA   Toxic metabolic encephalopathy   AKI (acute kidney injury)   Discharge Condition: Guarded  Diet recommendation: As tolerated, for pleasure  Filed Weights   01/15/14 0123 01/16/14 0612 01/17/14 0434  Weight: 67.1 kg (147 lb 14.9 oz) 67.8 kg (149 lb 7.6 oz) 64.8 kg (142 lb 13.7 oz)    History of present illness:  [name] is a 80 y.o. female with PMH of Advanced Dementia recently moved to [facility] from ALF due to need for higher level of care, she also has h/o of DM on Insulin, CKD 2, HTN.  Per Daughter at baseline, she is alert and mumbles yes or no but does not talk otherwise, cannot recognize any family.  Today she was sent from SNF due to decreased responsiveness compared to baseline, temp of 102.  EMS was called her initial BP was 88/40 which improved some with IVF.  No other reliable history available at this time.  In ER, BP was low and improved with IVF bolus to 103/43, also noted to be tachypneic and febrile.  Labs revealed, hypernatremia, AKI, Elevated BUN, leukocytosis and abnormal UA with numerous WBCs and many bacteria.  Hospital Course:   Sepsis due to UTI/Pyelo  -Presented with sepsis secondary to pyelonephritis/UTI, resolved currently. -Initial discussion Dr. Joseph Art with the family patient DNR/DNI and no heroic measures. -Later discussion Dr. Jerral Ralph with the family and they decided for full comfort. -Patient for full comfort to hospice home. All medications discontinued including insulin. -Patient discharged on Roxanol and lorazepam Intensol  E coli UTI -  quinolone resistant  -will complete 10 days of abx tx. Currently on IV Rocephin since 9/24   Acute Kidney injury  -now resolved - due to sepsis > ATN - baseline creatinine of 1.2 in 12/14 - renal US w/ no hydronephrosis and medical renal disease   Toxic metabolic encephalopathy  -Multifactorial to include Sepsis, AKI, Uremia in the background of Advanced Dementia.  DIABETES MELLITUS, TYPE II   -CBG erratic, likely due to very poor intake, insulin discontinued at the time of discharge.  Hypernatremia  - resolved w/ free water admin   Hypokalemia  -Resolved, but trending down again due to poor intake - follow   Advanced DEMENTIA  -resume aricept when able to take orals   Hip pain  -Xrays of Hip reveal DJD w/ no fx    Procedures:  None  Consultations:  None  Discharge Exam: Filed Vitals:   01/17/14 0513  BP: 116/33  Pulse: 65  Temp: 97.5 F (36.4 C)  Resp: 16   General: Lethargic, does not respond to verbal stimuli, moans to tactile stimuli HEENT: anicteric sclera, pupils reactive to light and accommodation, EOMI CVS: S1-S2 clear, no murmur rubs or gallops Chest: clear to auscultation bilaterally, no wheezing, rales or rhonchi Abdomen: soft nontender, nondistended, normal bowel sounds, no organomegaly Extremities: no cyanosis, clubbing or edema noted bilaterally Neuro: Not done because of lethargy.  Discharge Instructions You were cared for by a hospitalist during your hospital stay. If you have any questions about your discharge medications or the care you received while you were in  the hospital after you are discharged, you can call the unit and asked to speak with the hospitalist on call if the hospitalist that took care of you is not available. Once you are discharged, your primary care physician will handle any further medical issues. Please note that NO REFILLS for any discharge medications will be authorized once you are discharged, as it is imperative that you  return to your primary care physician (or establish a relationship with a primary care physician if you do not have one) for your aftercare needs so that they can reassess your need for medications and monitor your lab values.   Current Discharge Medication List    START taking these medications   Details  LORazepam (ATIVAN) 2 MG/ML concentrated solution Take 0.5 mLs (1 mg total) by mouth every 8 (eight) hours. Qty: 30 mL, Refills: 0    morphine (ROXANOL) 20 MG/ML concentrated solution Take 0.25 mLs (5 mg total) by mouth every 3 (three) hours as needed for severe pain. Qty: 30 mL, Refills: 0      STOP taking these medications     acetaminophen (TYLENOL) 650 MG suppository      aspirin 81 MG tablet      cholecalciferol (VITAMIN D) 400 UNITS TABS      docusate sodium (COLACE) 100 MG capsule      donepezil (ARICEPT) 5 MG tablet      Fiber POWD      furosemide (LASIX) 20 MG tablet      insulin detemir (LEVEMIR) 100 UNIT/ML injection      insulin lispro (HUMALOG KWIKPEN) 100 UNIT/ML SOPN      lisinopril (PRINIVIL,ZESTRIL) 2.5 MG tablet      multivitamin-lutein (OCUVITE-LUTEIN) CAPS      omeprazole (PRILOSEC) 20 MG capsule      pravastatin (PRAVACHOL) 20 MG tablet      senna (SENOKOT) 8.6 MG TABS tablet      vitamin B-12 (CYANOCOBALAMIN) 1000 MCG tablet        Allergies  Allergen Reactions  . Penicillins Other (See Comments)    Per [facility]      The results of significant diagnostics from this hospitalization (including imaging, microbiology, ancillary and laboratory) are listed below for reference.    Significant Diagnostic Studies: Dg Hip Bilateral W/pelvis  01/11/2014   CLINICAL DATA:  Hip pain with movement  EXAM: BILATERAL HIP WITH PELVIS - 4+ VIEW  COMPARISON:  None.  FINDINGS: There is no acute fracture or dislocation. There are narrow bilateral hip joint spaces.  IMPRESSION: Degenerative joint changes of bilateral hips. No acute fracture or dislocation.    Electronically Signed   By: Sherian ReinWei-Chen  Lin M.D.   On: 01/11/2014 20:47   [place] Renal  01/11/2014   CLINICAL DATA:  Increase creatinine.  Chronic renal disease.  EXAM: RENAL/URINARY TRACT ULTRASOUND COMPLETE  COMPARISON:  None.  FINDINGS: Right Kidney:  Length: 9 cm. The right kidney is echogenic with cortical thinning. No mass or hydronephrosis visualized.  Left Kidney:  Length: 11.2 cm. The left kidney is echogenic with cortical thinning. No mass or hydronephrosis visualized.  Bladder:  The bladder is decompressed, not well seen.  There is incidental finding of gallstones.  IMPRESSION: Findings consistent with medical renal disease. There is no hydronephrosis bilaterally.   Electronically Signed   By: Sherian ReinWei-Chen  Lin M.D.   On: 01/11/2014 19:30   Dg Chest Port 1 View  01/11/2014   CLINICAL DATA:  Fever and altered mental status.  EXAM: PORTABLE  CHEST - 1 VIEW  COMPARISON:  PA and lateral chest 04/17/2013. Single view of the chest 04/19/2013.  FINDINGS: The lungs are clear. Heart size is normal. No pneumothorax or pleural effusion.  IMPRESSION: Negative chest.   Electronically Signed   By: Drusilla Kanner M.D.   On: 01/11/2014 14:26    Microbiology: Recent Results (from the past 240 hour(s))  URINE CULTURE     Status: None   Collection Time    01/11/14  2:02 PM      Result Value Ref Range Status   Specimen Description URINE, CATHETERIZED   Final   Special Requests NONE   Final   Culture  Setup Time     Final   Value: 01/11/2014 19:37     Performed at [facility] Count     Final   Value: >=100,000 COLONIES/ML     Performed at [facility]   Culture     Final   Value: ESCHERICHIA COLI     Performed at [facility]   Report Status 01/13/2014 FINAL   Final   Organism ID, Bacteria ESCHERICHIA COLI   Final  CULTURE, BLOOD (ROUTINE X 2)     Status: None   Collection Time    01/11/14  2:14 PM      Result Value Ref Range Status   Specimen Description BLOOD HAND  LEFT   Final   Special Requests BOTTLES DRAWN AEROBIC AND ANAEROBIC 5CC   Final   Culture  Setup Time     Final   Value: 01/12/2014 07:44     Performed at [facility]   Culture     Final   Value:        BLOOD CULTURE RECEIVED NO GROWTH TO DATE CULTURE WILL BE HELD FOR 5 DAYS BEFORE ISSUING A FINAL NEGATIVE REPORT     Performed at [facility]   Report Status PENDING   Incomplete  CULTURE, BLOOD (ROUTINE X 2)     Status: None   Collection Time    01/11/14  2:19 PM      Result Value Ref Range Status   Specimen Description BLOOD HAND RIGHT   Final   Special Requests BOTTLES DRAWN AEROBIC ONLY 4CC   Final   Culture  Setup Time     Final   Value: 01/12/2014 07:43     Performed at [facility]   Culture     Final   Value:        BLOOD CULTURE RECEIVED NO GROWTH TO DATE CULTURE WILL BE HELD FOR 5 DAYS BEFORE ISSUING A FINAL NEGATIVE REPORT     Performed at [facility]   Report Status PENDING   Incomplete  MRSA PCR SCREENING     Status: None   Collection Time    01/11/14  8:20 PM      Result Value Ref Range Status   MRSA by PCR NEGATIVE  NEGATIVE Final   Comment:            The GeneXpert MRSA Assay (FDA     approved for NASAL specimens     only), is one component of a     comprehensive MRSA colonization     surveillance program. It is not     intended to diagnose MRSA     infection nor to guide or     monitor treatment for     MRSA infections.     Labs:  Basic Metabolic Panel:  Recent Labs Lab 01/12/14 0340 01/12/14 1520 01/13/14 1018 01/14/14 0343 01/15/14 0228 01/16/14 0610  NA 165* 159* 151* 148* 144 137  K 3.5* 3.8 3.5* 3.7 3.5* 3.7  CL 128* 127* 118* 113* 110 105  CO2 24 23 22 24 23 21   GLUCOSE 143* 158* 272* 74 63* 248*  BUN 109* 88* 63* 46* 32* 21  CREATININE 2.42* 1.85* 1.35* 1.22* 1.10 0.91  CALCIUM 8.4 7.9* 8.0* 8.2* 7.9* 7.7*  MG  --  2.7* 2.6* 2.5 2.4  --    Liver Function Tests:  Recent Labs Lab 01/12/14 0340  01/12/14 1520 01/13/14 1018 01/14/14 0343 01/15/14 0228  AST 40* 32 35 39* 37  ALT 52* 40* 42* 43* 37*  ALKPHOS 96 80 92 99 105  BILITOT 0.5 0.5 0.4 0.4 0.4  PROT 5.8* 5.0* 5.2* 5.4* 5.2*  ALBUMIN 2.2* 2.0* 2.0* 2.1* 2.0*   No results found for this basename: LIPASE, AMYLASE,  in the last 168 hours No results found for this basename: AMMONIA,  in the last 168 hours CBC:  Recent Labs Lab 01/11/14 1325 01/12/14 0340 01/13/14 1018 01/14/14 0343 01/15/14 0228 01/16/14 0610  WBC 13.1* 14.4* 13.7* 12.1* 9.7 9.4  NEUTROABS 10.5*  --  10.8* 8.8* 6.6  --   HGB 14.2 12.8 12.1 12.3 12.0 11.6*  HCT 44.7 40.7 38.1 36.8 36.3 34.3*  MCV 94.7 94.7 94.1 92.0 91.4 89.1  PLT 169 163 120* 125* 112* 127*   Cardiac Enzymes:  Recent Labs Lab 01/11/14 1325  TROPONINI <0.30   BNP: BNP (last 3 results) No results found for this basename: PROBNP,  in the last 8760 hours CBG:  Recent Labs Lab 01/15/14 1153 01/15/14 1723 01/15/14 2152 01/16/14 0744 01/16/14 1202  GLUCAP 123* 170* 270* 261* 196*       Signed:  Allis Quirarte A  [facility] 01/17/2014, 11:35 AM

## 2014-01-17 NOTE — Plan of Care (Signed)
Problem: Phase I Progression Outcomes Goal: Hemodynamically stable Outcome: Adequate for Discharge Patient is discharging to residential hospice

## 2014-01-18 LAB — CULTURE, BLOOD (ROUTINE X 2)
CULTURE: NO GROWTH
Culture: NO GROWTH

## 2014-02-18 DEATH — deceased

## 2014-10-27 IMAGING — CR DG PELVIS 1-2V
1 series · 1 of 1 positions shown · non-contrast
Comparison: 12/12/2012

CLINICAL DATA: Status post fall

EXAM:
PELVIS - 1-2 VIEW

[x pelvis]
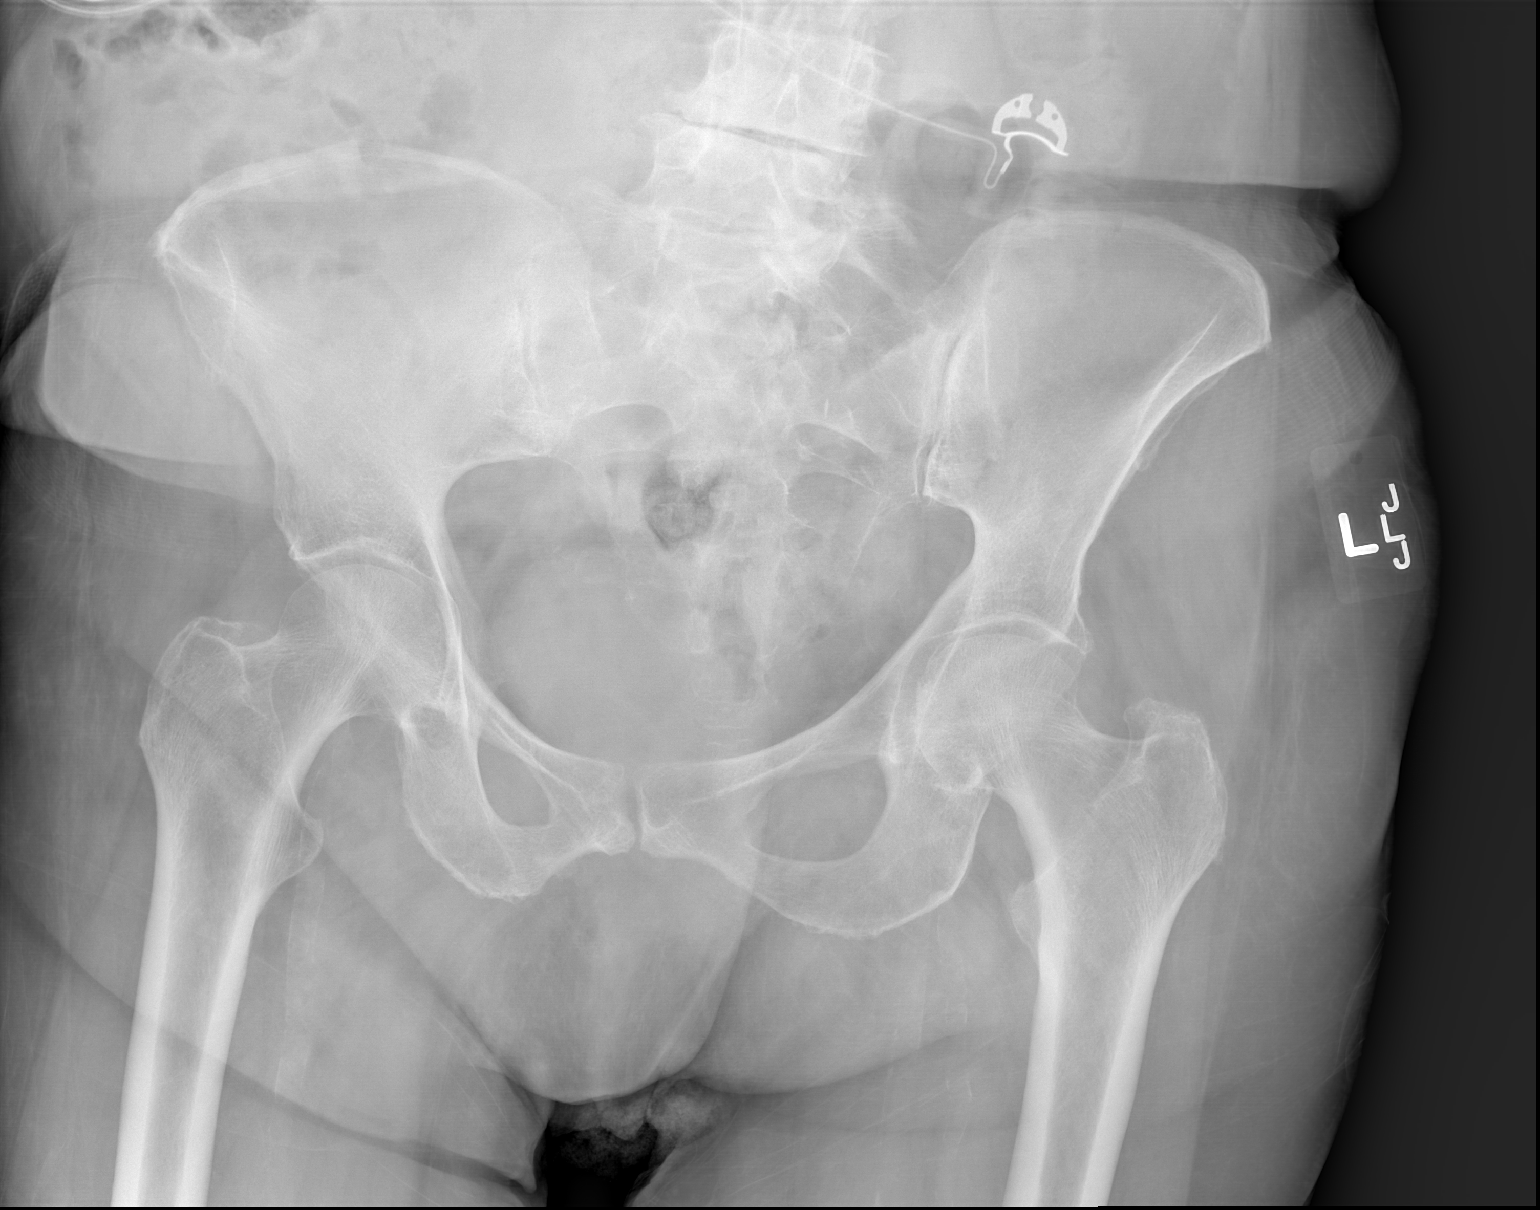

[1 of 1 positions shown; findings below may reference images not displayed]

FINDINGS: There is no evidence of pelvic fracture or diastasis. No other
pelvic bone lesions are seen.
IMPRESSION: Negative.

## 2014-10-27 IMAGING — CR DG CHEST 2V
2 series · 2 of 2 positions shown · non-contrast
Comparison: April 18, 2009.

CLINICAL DATA: Fall.

EXAM:
CHEST  2 VIEW

[w chest lat]
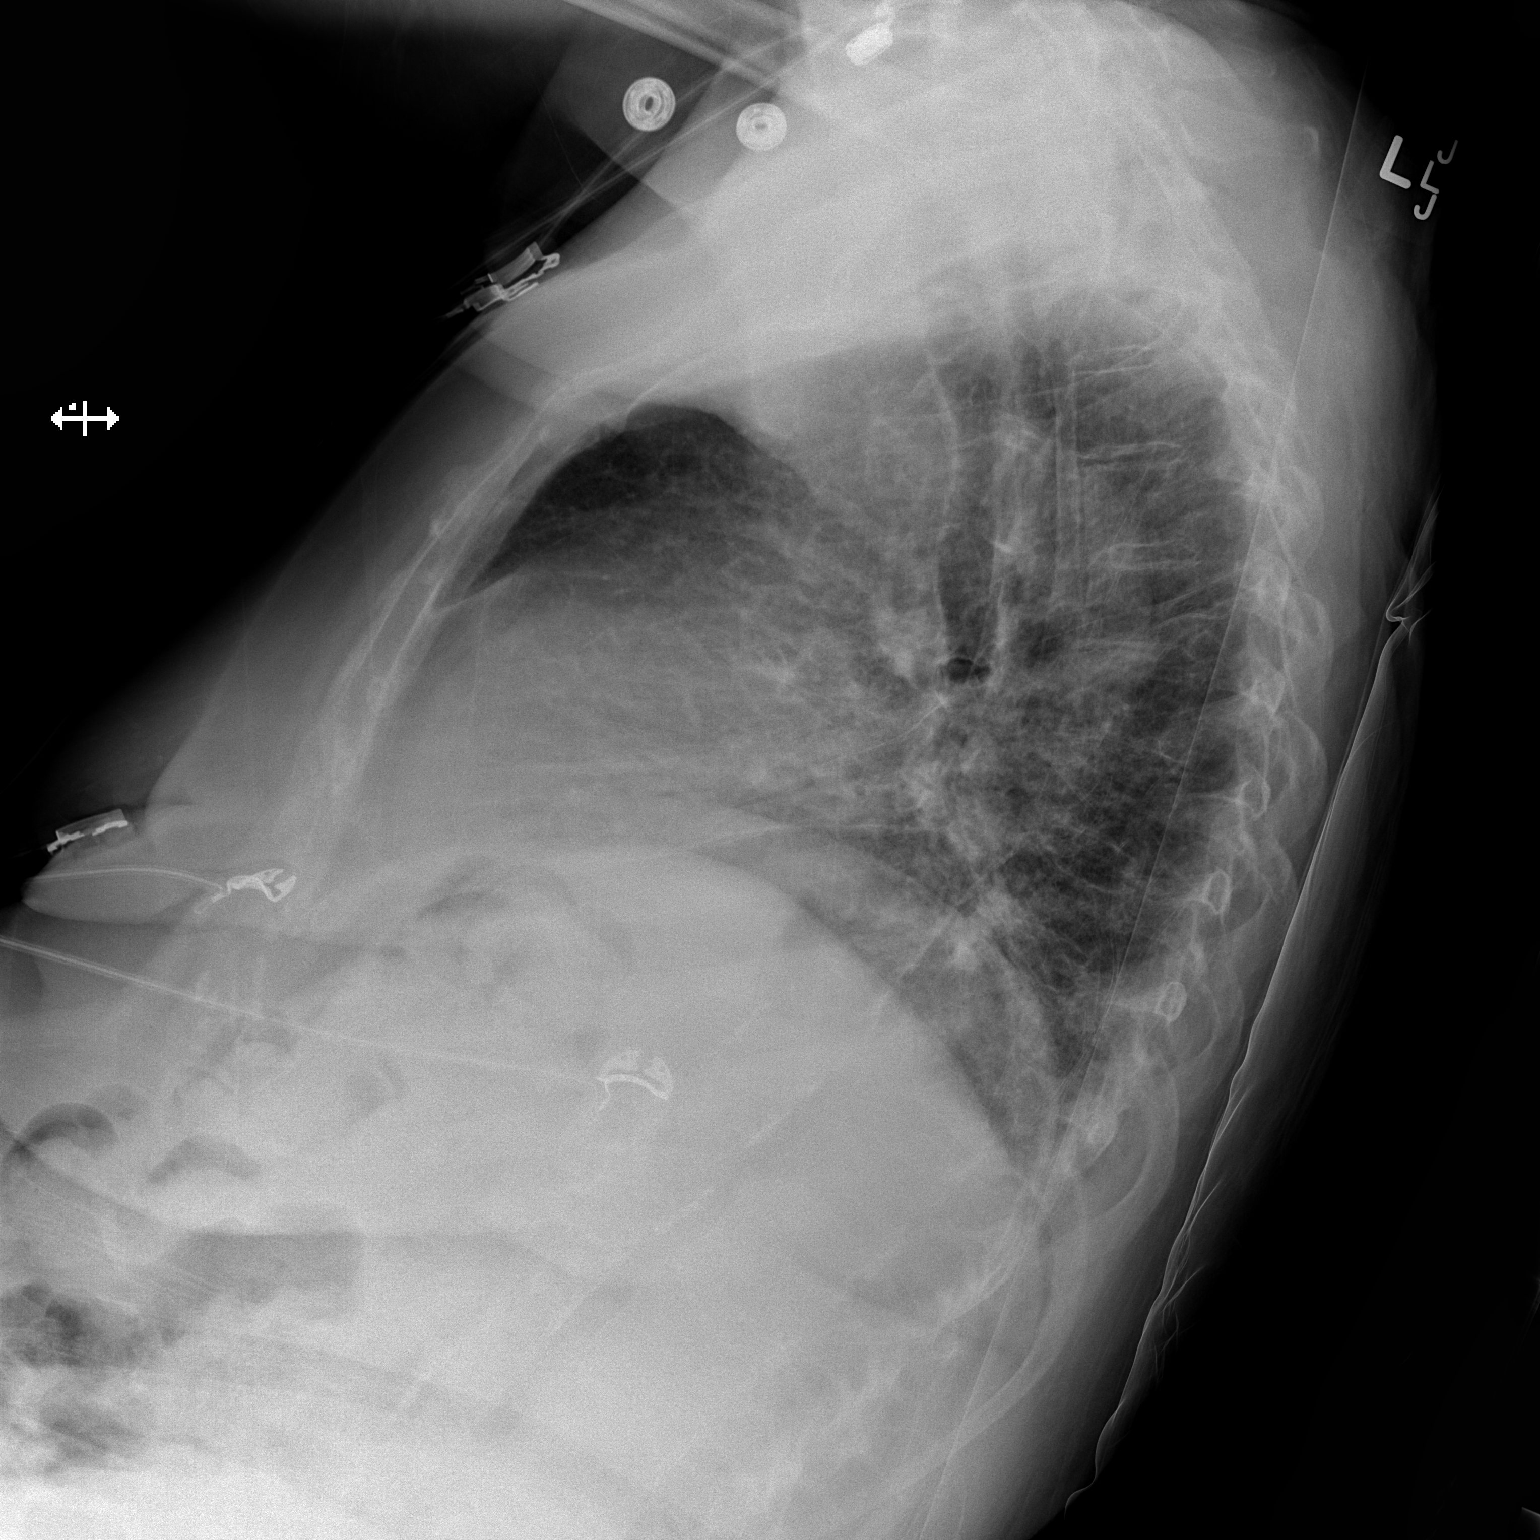

[x chest ap]
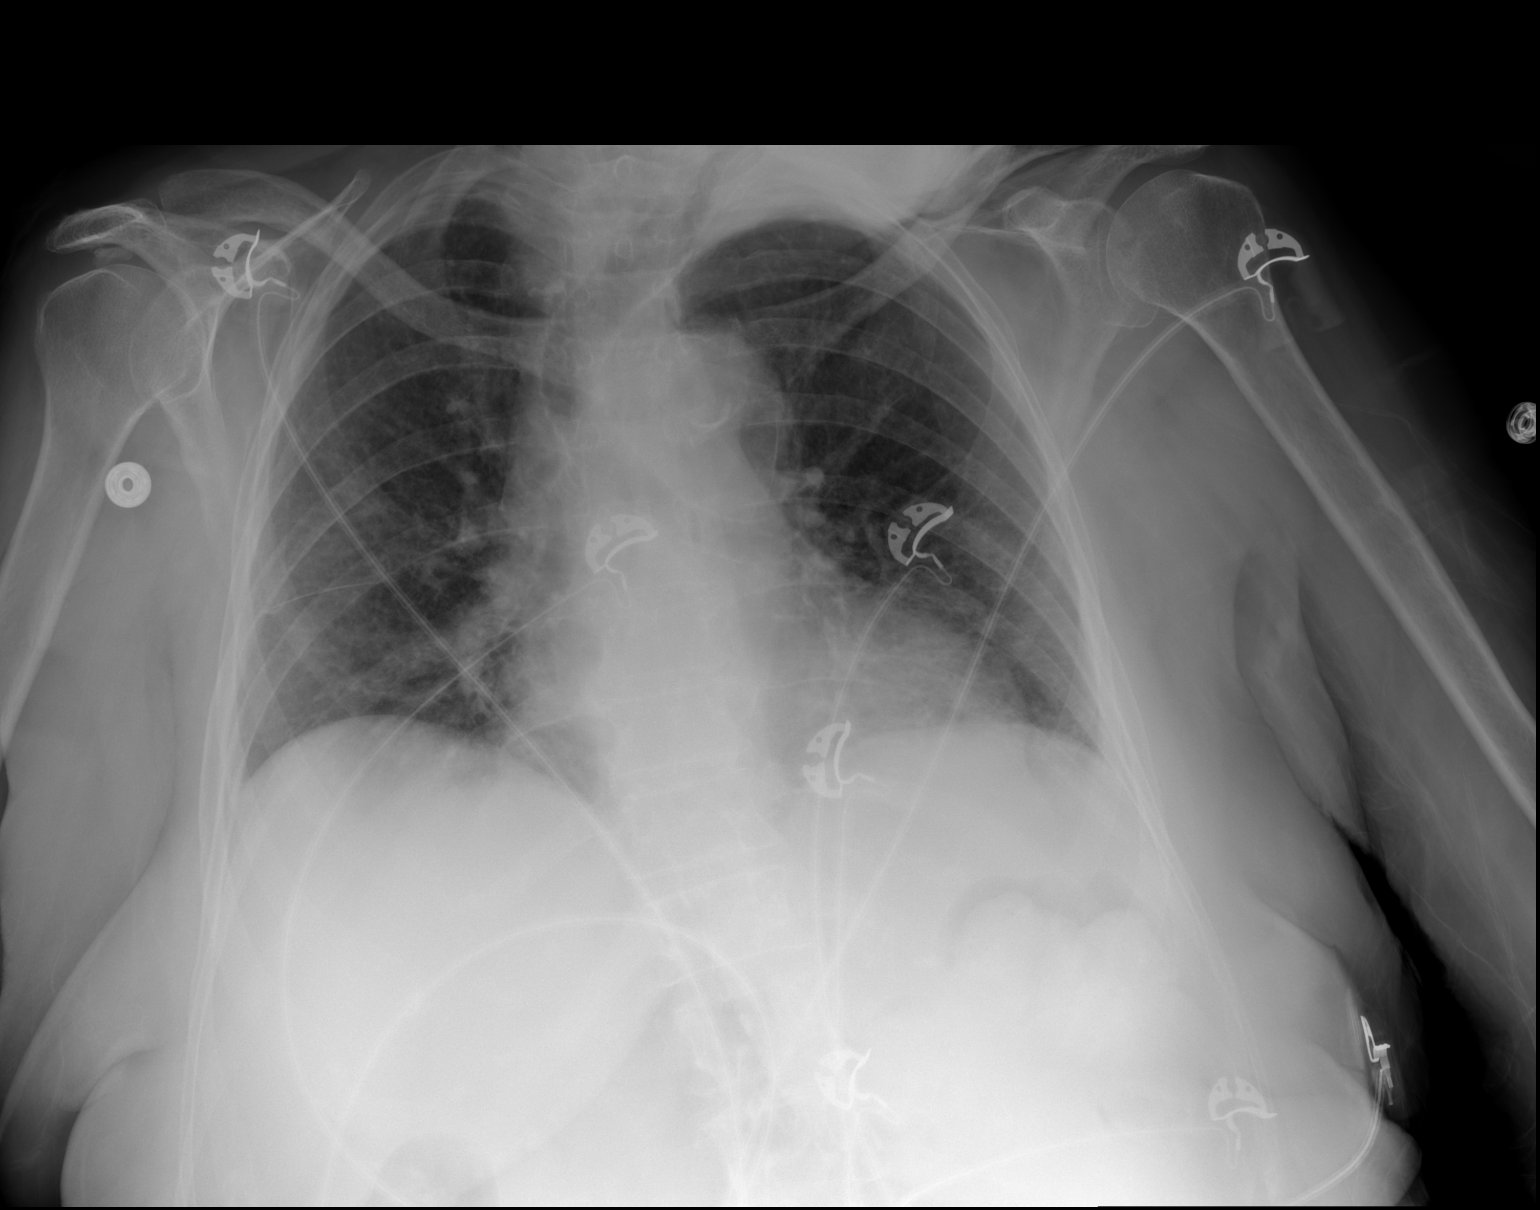

[2 of 2 positions shown; findings below may reference images not displayed]

FINDINGS: Stable cardiomediastinal silhouette. No pneumothorax or pleural
effusion is noted. Right lung is clear. Minimal lingular opacity is
noted which may represent early pneumonia or scarring. Possible
nodular density seen in left upper lobe. Bony thorax appears intact.
IMPRESSION: Minimal lingular opacity is noted which may represent scarring or
early pneumonia. Possible nodular density is seen in the left upper
lobe; further evaluation with chest CT is recommended for further
evaluation.
